# Patient Record
Sex: Male | Born: 1975 | Race: Black or African American | Hispanic: No | Marital: Married
Health system: Southern US, Community
[De-identification: ages and names within clinical notes are randomized; demographics above are authoritative.]

## PROBLEM LIST (undated history)

## (undated) DIAGNOSIS — N182 Chronic kidney disease, stage 2 (mild): Secondary | ICD-10-CM

## (undated) DIAGNOSIS — E66813 Obesity, class 3: Secondary | ICD-10-CM

## (undated) DIAGNOSIS — G4733 Obstructive sleep apnea (adult) (pediatric): Secondary | ICD-10-CM

## (undated) DIAGNOSIS — G4730 Sleep apnea, unspecified: Secondary | ICD-10-CM

## (undated) DIAGNOSIS — R6882 Decreased libido: Secondary | ICD-10-CM

## (undated) DIAGNOSIS — G43709 Chronic migraine without aura, not intractable, without status migrainosus: Secondary | ICD-10-CM

## (undated) DIAGNOSIS — K589 Irritable bowel syndrome without diarrhea: Secondary | ICD-10-CM

## (undated) DIAGNOSIS — T7840XA Allergy, unspecified, initial encounter: Secondary | ICD-10-CM

## (undated) DIAGNOSIS — K2970 Gastritis, unspecified, without bleeding: Secondary | ICD-10-CM

## (undated) DIAGNOSIS — R030 Elevated blood-pressure reading, without diagnosis of hypertension: Secondary | ICD-10-CM

## (undated) DIAGNOSIS — K219 Gastro-esophageal reflux disease without esophagitis: Secondary | ICD-10-CM

## (undated) DIAGNOSIS — M545 Low back pain, unspecified: Secondary | ICD-10-CM

## (undated) DIAGNOSIS — B9681 Helicobacter pylori [H. pylori] as the cause of diseases classified elsewhere: Secondary | ICD-10-CM

## (undated) HISTORY — DX: Morbid (severe) obesity due to excess calories: E66.01

## (undated) HISTORY — DX: Irritable bowel syndrome, unspecified: K58.9

## (undated) HISTORY — DX: Gastritis, unspecified, without bleeding: K29.70

## (undated) HISTORY — DX: Helicobacter pylori (H. pylori) as the cause of diseases classified elsewhere: B96.81

## (undated) HISTORY — DX: Chronic kidney disease, stage 2 (mild): N18.2

## (undated) HISTORY — DX: Allergy, unspecified, initial encounter: T78.40XA

## (undated) HISTORY — DX: Low back pain, unspecified: M54.50

## (undated) HISTORY — DX: Low back pain: M54.5

## (undated) HISTORY — DX: Obesity, class 3: E66.813

## (undated) HISTORY — DX: Decreased libido: R68.82

## (undated) HISTORY — DX: Obstructive sleep apnea (adult) (pediatric): G47.33

## (undated) HISTORY — DX: Sleep apnea, unspecified: G47.30

## (undated) HISTORY — DX: Chronic migraine without aura, not intractable, without status migrainosus: G43.709

## (undated) HISTORY — DX: Gastro-esophageal reflux disease without esophagitis: K21.9

## (undated) HISTORY — DX: Elevated blood-pressure reading, without diagnosis of hypertension: R03.0

---

## 2007-10-19 DIAGNOSIS — R5381 Other malaise: Secondary | ICD-10-CM | POA: Insufficient documentation

## 2009-07-18 DIAGNOSIS — G43009 Migraine without aura, not intractable, without status migrainosus: Secondary | ICD-10-CM | POA: Insufficient documentation

## 2009-12-28 ENCOUNTER — Ambulatory Visit: Payer: Self-pay | Admitting: General Practice

## 2012-09-08 ENCOUNTER — Ambulatory Visit: Payer: Self-pay | Admitting: Family Medicine

## 2014-05-02 LAB — BASIC METABOLIC PANEL
BUN: 9 mg/dL (ref 4–21)
CREATININE: 1.5 mg/dL — AB (ref 0.6–1.3)
GLUCOSE: 107 mg/dL
POTASSIUM: 4.6 mmol/L (ref 3.4–5.3)
Sodium: 140 mmol/L (ref 137–147)

## 2014-05-02 LAB — LIPID PANEL: LDl/HDL Ratio: 3.5

## 2014-05-02 LAB — CBC AND DIFFERENTIAL
HEMATOCRIT: 41 % (ref 41–53)
Hemoglobin: 13.7 g/dL (ref 13.5–17.5)
NEUTROS ABS: 50 /uL
Platelets: 279 10*3/uL (ref 150–399)
WBC: 5.8 10*3/mL

## 2014-05-02 LAB — HEPATIC FUNCTION PANEL
ALK PHOS: 55 U/L (ref 25–125)
ALT: 19 U/L (ref 10–40)
AST: 22 U/L (ref 14–40)

## 2014-05-08 ENCOUNTER — Ambulatory Visit: Payer: Self-pay | Admitting: Family Medicine

## 2014-05-30 ENCOUNTER — Ambulatory Visit: Payer: Self-pay | Admitting: Physician Assistant

## 2014-10-19 DIAGNOSIS — M549 Dorsalgia, unspecified: Secondary | ICD-10-CM | POA: Insufficient documentation

## 2014-10-19 DIAGNOSIS — R200 Anesthesia of skin: Secondary | ICD-10-CM | POA: Insufficient documentation

## 2014-10-19 DIAGNOSIS — Z111 Encounter for screening for respiratory tuberculosis: Secondary | ICD-10-CM | POA: Insufficient documentation

## 2014-10-19 DIAGNOSIS — J309 Allergic rhinitis, unspecified: Secondary | ICD-10-CM | POA: Insufficient documentation

## 2014-10-19 DIAGNOSIS — R030 Elevated blood-pressure reading, without diagnosis of hypertension: Secondary | ICD-10-CM | POA: Insufficient documentation

## 2014-10-19 DIAGNOSIS — IMO0001 Reserved for inherently not codable concepts without codable children: Secondary | ICD-10-CM | POA: Insufficient documentation

## 2014-10-19 DIAGNOSIS — G4733 Obstructive sleep apnea (adult) (pediatric): Secondary | ICD-10-CM | POA: Insufficient documentation

## 2014-10-19 DIAGNOSIS — K219 Gastro-esophageal reflux disease without esophagitis: Secondary | ICD-10-CM | POA: Insufficient documentation

## 2014-10-19 DIAGNOSIS — K589 Irritable bowel syndrome without diarrhea: Secondary | ICD-10-CM | POA: Insufficient documentation

## 2014-10-19 DIAGNOSIS — N182 Chronic kidney disease, stage 2 (mild): Secondary | ICD-10-CM | POA: Insufficient documentation

## 2014-10-19 DIAGNOSIS — B9681 Helicobacter pylori [H. pylori] as the cause of diseases classified elsewhere: Secondary | ICD-10-CM | POA: Insufficient documentation

## 2014-10-19 DIAGNOSIS — Z713 Dietary counseling and surveillance: Secondary | ICD-10-CM | POA: Insufficient documentation

## 2014-10-19 DIAGNOSIS — J069 Acute upper respiratory infection, unspecified: Secondary | ICD-10-CM

## 2014-10-19 DIAGNOSIS — K625 Hemorrhage of anus and rectum: Secondary | ICD-10-CM | POA: Insufficient documentation

## 2014-10-19 DIAGNOSIS — B9689 Other specified bacterial agents as the cause of diseases classified elsewhere: Secondary | ICD-10-CM | POA: Insufficient documentation

## 2014-10-19 DIAGNOSIS — K2970 Gastritis, unspecified, without bleeding: Secondary | ICD-10-CM

## 2014-10-19 DIAGNOSIS — R202 Paresthesia of skin: Secondary | ICD-10-CM

## 2014-10-19 DIAGNOSIS — R109 Unspecified abdominal pain: Secondary | ICD-10-CM | POA: Insufficient documentation

## 2014-10-19 DIAGNOSIS — S93409A Sprain of unspecified ligament of unspecified ankle, initial encounter: Secondary | ICD-10-CM | POA: Insufficient documentation

## 2014-10-19 DIAGNOSIS — K5900 Constipation, unspecified: Secondary | ICD-10-CM | POA: Insufficient documentation

## 2014-10-19 DIAGNOSIS — M25569 Pain in unspecified knee: Secondary | ICD-10-CM | POA: Insufficient documentation

## 2014-12-08 ENCOUNTER — Encounter (INDEPENDENT_AMBULATORY_CARE_PROVIDER_SITE_OTHER): Payer: Self-pay | Admitting: Family Medicine

## 2014-12-13 ENCOUNTER — Telehealth: Payer: Self-pay

## 2014-12-13 ENCOUNTER — Ambulatory Visit (INDEPENDENT_AMBULATORY_CARE_PROVIDER_SITE_OTHER): Payer: BC Managed Care – PPO | Admitting: Family Medicine

## 2014-12-13 ENCOUNTER — Encounter: Payer: Self-pay | Admitting: Family Medicine

## 2014-12-13 ENCOUNTER — Ambulatory Visit
Admission: RE | Admit: 2014-12-13 | Discharge: 2014-12-13 | Disposition: A | Discharge status: Home / Self Care | Payer: BC Managed Care – PPO | Source: Ambulatory Visit | Attending: Family Medicine | Admitting: Family Medicine

## 2014-12-13 VITALS — BP 130/86 | HR 100 | Temp 98.2°F | Resp 18 | Ht 76.0 in | Wt 330.2 lb

## 2014-12-13 DIAGNOSIS — Z0000 Encounter for general adult medical examination without abnormal findings: Secondary | ICD-10-CM | POA: Diagnosis not present

## 2014-12-13 DIAGNOSIS — M25561 Pain in right knee: Secondary | ICD-10-CM

## 2014-12-13 DIAGNOSIS — M25461 Effusion, right knee: Secondary | ICD-10-CM | POA: Diagnosis not present

## 2014-12-13 DIAGNOSIS — M25562 Pain in left knee: Secondary | ICD-10-CM

## 2014-12-13 DIAGNOSIS — M170 Bilateral primary osteoarthritis of knee: Secondary | ICD-10-CM | POA: Insufficient documentation

## 2014-12-13 NOTE — Patient Instructions (Signed)
Knee Exercises EXERCISES RANGE OF MOTION (ROM) AND STRETCHING EXERCISES These exercises may help you when beginning to rehabilitate your injury. Your symptoms may resolve with or without further involvement from your physician, physical therapist, or athletic trainer. While completing these exercises, remember:   Restoring tissue flexibility helps normal motion to return to the joints. This allows healthier, less painful movement and activity.  An effective stretch should be held for at least 30 seconds.  A stretch should never be painful. You should only feel a gentle lengthening or release in the stretched tissue. STRETCH - Knee Extension, Prone  Lie on your stomach on a firm surface, such as a bed or countertop. Place your right / left knee and leg just beyond the edge of the surface. You may wish to place a towel under the far end of your right / left thigh for comfort.  Relax your leg muscles and allow gravity to straighten your knee. Your clinician may advise you to add an ankle weight if more resistance is helpful for you.  You should feel a stretch in the back of your right / left knee. Hold this position for __________ seconds. Repeat __________ times. Complete this stretch __________ times per day. * Your physician, physical therapist, or athletic trainer may ask you to add ankle weight to enhance your stretch.  RANGE OF MOTION - Knee Flexion, Active  Lie on your back with both knees straight. (If this causes back discomfort, bend your opposite knee, placing your foot flat on the floor.)  Slowly slide your heel back toward your buttocks until you feel a gentle stretch in the front of your knee or thigh.  Hold for __________ seconds. Slowly slide your heel back to the starting position. Repeat __________ times. Complete this exercise __________ times per day.  STRETCH - Quadriceps, Prone   Lie on your stomach on a firm surface, such as a bed or padded floor.  Bend your right /  left knee and grasp your ankle. If you are unable to reach your ankle or pant leg, use a belt around your foot to lengthen your reach.  Gently pull your heel toward your buttocks. Your knee should not slide out to the side. You should feel a stretch in the front of your thigh and/or knee.  Hold this position for __________ seconds. Repeat __________ times. Complete this stretch __________ times per day.  STRETCH - Hamstrings, Supine   Lie on your back. Loop a belt or towel over the ball of your right / left foot.  Straighten your right / left knee and slowly pull on the belt to raise your leg. Do not allow the right / left knee to bend. Keep your opposite leg flat on the floor.  Raise the leg until you feel a gentle stretch behind your right / left knee or thigh. Hold this position for __________ seconds. Repeat __________ times. Complete this stretch __________ times per day.  STRENGTHENING EXERCISES These exercises may help you when beginning to rehabilitate your injury. They may resolve your symptoms with or without further involvement from your physician, physical therapist, or athletic trainer. While completing these exercises, remember:   Muscles can gain both the endurance and the strength needed for everyday activities through controlled exercises.  Complete these exercises as instructed by your physician, physical therapist, or athletic trainer. Progress the resistance and repetitions only as guided.  You may experience muscle soreness or fatigue, but the pain or discomfort you are trying to eliminate  should never worsen during these exercises. If this pain does worsen, stop and make certain you are following the directions exactly. If the pain is still present after adjustments, discontinue the exercise until you can discuss the trouble with your clinician. STRENGTH - Quadriceps, Isometrics  Lie on your back with your right / left leg extended and your opposite knee  bent.  Gradually tense the muscles in the front of your right / left thigh. You should see either your knee cap slide up toward your hip or increased dimpling just above the knee. This motion will push the back of the knee down toward the floor/mat/bed on which you are lying.  Hold the muscle as tight as you can without increasing your pain for __________ seconds.  Relax the muscles slowly and completely in between each repetition. Repeat __________ times. Complete this exercise __________ times per day.  STRENGTH - Quadriceps, Short Arcs   Lie on your back. Place a __________ inch towel roll under your knee so that the knee slightly bends.  Raise only your lower leg by tightening the muscles in the front of your thigh. Do not allow your thigh to rise.  Hold this position for __________ seconds. Repeat __________ times. Complete this exercise __________ times per day.  OPTIONAL ANKLE WEIGHTS: Begin with ____________________, but DO NOT exceed ____________________. Increase in 1 pound/0.5 kilogram increments.  STRENGTH - Quadriceps, Straight Leg Raises  Quality counts! Watch for signs that the quadriceps muscle is working to insure you are strengthening the correct muscles and not "cheating" by substituting with healthier muscles.  Lay on your back with your right / left leg extended and your opposite knee bent.  Tense the muscles in the front of your right / left thigh. You should see either your knee cap slide up or increased dimpling just above the knee. Your thigh may even quiver.  Tighten these muscles even more and raise your leg 4 to 6 inches off the floor. Hold for __________ seconds.  Keeping these muscles tense, lower your leg.  Relax the muscles slowly and completely in between each repetition. Repeat __________ times. Complete this exercise __________ times per day.  STRENGTH - Hamstring, Curls  Lay on your stomach with your legs extended. (If you lay on a bed, your feet  may hang over the edge.)  Tighten the muscles in the back of your thigh to bend your right / left knee up to 90 degrees. Keep your hips flat on the bed/floor.  Hold this position for __________ seconds.  Slowly lower your leg back to the starting position. Repeat __________ times. Complete this exercise __________ times per day.  OPTIONAL ANKLE WEIGHTS: Begin with ____________________, but DO NOT exceed ____________________. Increase in 1 pound/0.5 kilogram increments.  STRENGTH - Quadriceps, Squats  Stand in a door frame so that your feet and knees are in line with the frame.  Use your hands for balance, not support, on the frame.  Slowly lower your weight, bending at the hips and knees. Keep your lower legs upright so that they are parallel with the door frame. Squat only within the range that does not increase your knee pain. Never let your hips drop below your knees.  Slowly return upright, pushing with your legs, not pulling with your hands. Repeat __________ times. Complete this exercise __________ times per day.  STRENGTH - Quadriceps, Wall Slides  Follow guidelines for form closely. Increased knee pain often results from poorly placed feet or knees.  [employer]  against a smooth wall or door and walk your feet out 18-24 inches. Place your feet hip-width apart.  Slowly slide down the wall or door until your knees bend __________ degrees.* Keep your knees over your heels, not your toes, and in line with your hips, not falling to either side.  Hold for __________ seconds. Stand up to rest for __________ seconds in between each repetition. Repeat __________ times. Complete this exercise __________ times per day. * Your physician, physical therapist, or athletic trainer will alter this angle based on your symptoms and progress. Document Released: 04/16/2005 Document Revised: 10/17/2013 Document Reviewed: 09/14/2008 [facility] Patient Information 2015 [place], [place]. This information is not  intended to replace advice given to you by your health care provider. Make sure you discuss any questions you have with your health care provider.

## 2014-12-13 NOTE — Progress Notes (Signed)
Name: Samuel Chaney   MRN: [id number]    DOB: June 14, 1976   Date:12/13/2014       Progress Note  Subjective  Chief Complaint  Chief Complaint  Patient presents with  . Annual Exam    HPI  Patient is here today for a Complete Male Physical Exam:  The patient has no acute concerns but does have ongoing issues with his right knee since about 10/15/2014. Overall feels health is stable. Diet is not well balanced, large portions, unhealthy content. In general does not exercise regularly but also feels limited by his joint issues. Has access to a gym at his housing complex. Sees dentist regularly and addresses vision concerns with ophthalmologist if applicable. In regards to sexual activity the patient is currently sexually active. Currently is not concerned about exposure to any STDs.   Joint/Muscle Pain: Patient complains of arthralgias for which has been present for 2 months. Pain is located in the right knee (original injury) more than the left knee which is now starting to ache as he is shifting weight to his left side. Pain is described as aching and throbbing, and is intermittent .  Associated symptoms include: decreased range of motion and instability.  The patient has used pain medication during initial onset of injury but since has weaned off.  Has tried a OTC knee sleeve but it was pinching the back of his leg causing more discomfort. He has also tried massage therapy and home PT.     Past Medical History  Diagnosis Date  . Temporal pain   . Sleep apnea     History reviewed. No pertinent past surgical history.  Family History  Problem Relation Age of Onset  . Bleeding Disorder Mother   . Stroke Father   . Heart disease Father   . Alcohol abuse Father   . Depression Maternal Grandmother   . Mental illness Maternal Grandmother     History   Social History  . Marital Status: Married    Spouse Name: N/A  . Number of Children: 2  . Years of Education: N/A   Occupational  History  . correctional officier    Social History Main Topics  . Smoking status: Never Smoker   . Smokeless tobacco: Not on file  . Alcohol Use: No  . Drug Use: No  . Sexual Activity:    Partners: Female   Other Topics Concern  . Not on file   Social History Narrative    No current outpatient prescriptions on file.  No Known Allergies  ROS  CONSTITUTIONAL: No significant weight changes, fever, chills, weakness or fatigue.  HEENT:  - Eyes: No visual changes.  - Ears: No auditory changes. No pain.  - Nose: No sneezing, congestion, runny nose. - Throat: No sore throat. No changes in swallowing. SKIN: No rash or itching.  CARDIOVASCULAR: No chest pain, chest pressure or chest discomfort. No palpitations or edema.  RESPIRATORY: No shortness of breath, cough or sputum.  GASTROINTESTINAL: No anorexia, nausea, vomiting. No changes in bowel habits. No abdominal pain or blood.  GENITOURINARY: No dysuria. No frequency. No discharge.  NEUROLOGICAL: No headache, dizziness, syncope, paralysis, ataxia, numbness or tingling in the extremities. No memory changes. No change in bowel or bladder control.  MUSCULOSKELETAL: Yes knee joint pain. No muscle pain. HEMATOLOGIC: No anemia, bleeding or bruising.  LYMPHATICS: No enlarged lymph nodes.  PSYCHIATRIC: No change in mood. No change in sleep pattern.  ENDOCRINOLOGIC: No reports of sweating, cold or heat intolerance. No  polyuria or polydipsia.   Objective  Filed Vitals:   12/13/14 1144  BP: 130/86  Pulse: 100  Temp: 98.2 F (36.8 C)  TempSrc: Oral  Resp: 18  Height: 6' 4"  (1.93 m)  Weight: 330 lb 3.2 oz (149.778 kg)  SpO2: 98%   Body mass index is 40.21 kg/(m^2).  Depression screen PHQ 2/9 12/13/2014  Decreased Interest 0  Down, Depressed, Hopeless 0  PHQ - 2 Score 0     Physical Exam  Constitutional: Patient is obese and well-nourished. In no distress.  HEENT:  - Head: Normocephalic and atraumatic.  - Ears: Bilateral  TMs gray, no erythema or effusion - Nose: Nasal mucosa moist - Mouth/Throat: Oropharynx is clear and moist. No tonsillar hypertrophy or erythema. No post nasal drainage.  - Eyes: Conjunctivae clear, EOM movements normal. PERRLA. No scleral icterus.  Neck: Dark skin around base of neck. Normal range of motion. Neck supple. No JVD present. No thyromegaly present.  Cardiovascular: Normal rate, regular rhythm and normal heart sounds.  No murmur heard.  Pulmonary/Chest: Effort normal and breath sounds normal. No respiratory distress. Abdominal: Soft. Bowel sounds are normal, no distension. There is no tenderness. no masses BREAST: Bilateral breast exam normal with no masses, skin changes or nipple discharge MALE GENITALIA: Bilateral testes descended with no masses, no penile lesions, no penile discharge. PROSTATE: deferred RECTAL: deferred Musculoskeletal: Normal range of motion bilateral UE and LE, no joint effusions.  Right Knee Exam   Tenderness  The patient is experiencing tenderness in the medial joint line.  Range of Motion  Extension: normal  Flexion: normal   Muscle Strength   The patient has normal right knee strength.  Tests  McMurray:  Medial - negative Lateral - negative Lachman:  Anterior - negative    Posterior - negative Drawer:       Anterior - negative    Posterior - negative Varus: negative Valgus: negative  Other  Erythema: absent Sensation: normal Swelling: mild   Left Knee Exam   Tenderness  The patient is experiencing no tenderness.     Range of Motion  Extension: normal  Flexion: normal   Muscle Strength   The patient has normal left knee strength.  Tests  McMurray:  Medial - negative Lateral - negative Lachman:  Anterior - negative    Posterior - negative Drawer:       Anterior - negative     Posterior - negative Varus: negative Valgus: positive  Other  Erythema: absent Sensation: normal Swelling: none     Peripheral vascular:  Bilateral LE no edema. Neurological: CN II-XII grossly intact with no focal deficits. Alert and oriented to person, place, and time. Coordination, balance, strength, speech and gait are normal.  Skin: Skin is warm and dry. No rash noted. No erythema.  Psychiatric: Patient has a normal mood and affect. Behavior is normal in office today. Judgment and thought content normal in office today.    Assessment & Plan  1. Annual physical exam He has declined blood work today, he does not get annual flu exams. He is using his CPAP machine.  2. Right knee pain Will get Xray and order right knee brace through [facility].   - DG Knee Complete 4 Views Right; Future  3. Left knee pain Will get Xray  4. Obesity class III, BMI 40-49.9  The patient has been counseled on their higher than normal BMI.  They have verbally expressed understanding their increased risk for other diseases.  In  efforts to meet a better target BMI goal the patient has been counseled on lifestyle, diet and exercise modification tactics. Start with moderate intensity aerobic exercise (walking, jogging, elliptical, swimming, group or individual sports, hiking) at least 50mns a day at least 4 days a week and increase intensity, duration, frequency as tolerated. Diet should include well balance fresh fruits and vegetables avoiding processed foods, carbohydrates and sugars. Drink at least 8oz 10 glasses a day avoiding sodas, sugary fruit drinks, sweetened tea. Check weight on a reliable scale daily and monitor weight loss progress daily. Consider investing in mobile phone apps that will help keep track of weight loss goals.  - DG Knee Complete 4 Views Left; Future

## 2014-12-13 NOTE — Telephone Encounter (Signed)
A physician's statement of medical necessity was completed by Dr. Bobetta Lime and faxed to [phone]. Confirmation was received and items were sent to be scanned into this patient's chart.

## 2014-12-15 ENCOUNTER — Encounter: Payer: Self-pay | Admitting: Family Medicine

## 2014-12-15 DIAGNOSIS — M1711 Unilateral primary osteoarthritis, right knee: Secondary | ICD-10-CM | POA: Insufficient documentation

## 2015-03-21 ENCOUNTER — Ambulatory Visit (INDEPENDENT_AMBULATORY_CARE_PROVIDER_SITE_OTHER): Payer: BC Managed Care – PPO | Admitting: Family Medicine

## 2015-03-21 ENCOUNTER — Ambulatory Visit
Admission: RE | Admit: 2015-03-21 | Discharge: 2015-03-21 | Disposition: A | Discharge status: Home / Self Care | Payer: BC Managed Care – PPO | Source: Ambulatory Visit | Attending: Family Medicine | Admitting: Family Medicine

## 2015-03-21 ENCOUNTER — Encounter: Payer: Self-pay | Admitting: Family Medicine

## 2015-03-21 VITALS — BP 130/82 | HR 108 | Temp 98.1°F | Resp 18 | Wt 329.3 lb

## 2015-03-21 DIAGNOSIS — M722 Plantar fascial fibromatosis: Secondary | ICD-10-CM

## 2015-03-21 NOTE — Progress Notes (Signed)
Name: Samuel Chaney   MRN: [id number]    DOB: August 21, 1975   Date:03/21/2015       Progress Note  Subjective  Chief Complaint  Chief Complaint  Patient presents with  . Foot Pain    right foot, no known injury. patient stated he feels a knot everytime he walks. pain upon flexion.    HPI  Samuel Chaney is a 39 year old male with acute concerns of right foot pain for a few weeks now. The location is medial aspect near the ball of his foot. Rest alleviates the pain however the first few steps after resting are very painful. Feels like his muscle is tearing and there is a mass at the ball of his foot. No injury or trauma. He wears heavy shoes for work on hard ground and can not wear his shoe inserts.   Past Medical History  Diagnosis Date  . Temporal pain   . Sleep apnea   . Obesity, Class III, BMI 40-49.9 (morbid obesity) ([place])   . Chronic migraine w/o aura w/o status migrainosus, not intractable   . Severe obstructive sleep apnea   . Elevated blood pressure reading without diagnosis of hypertension   . Temporal pain   . Malaise and fatigue   . Decreased libido   . GERD (gastroesophageal reflux disease)   . Allergy   . Helicobacter positive gastritis   . IBS (irritable bowel syndrome)   . Acute pain of right knee   . Lumbosacral pain   . CKD (chronic kidney disease), stage II   . Dysphagia   . Numbness and tingling     Patient Active Problem List   Diagnosis Date Noted  . Primary osteoarthritis of right knee 12/15/2014  . Abdominal cramping 10/19/2014  . Gonalgia 10/19/2014  . Allergic rhinitis 10/19/2014  . Blood pressure elevated without history of HTN 10/19/2014  . Chronic kidney disease (CKD), stage II (mild) 10/19/2014  . CN (constipation) 10/19/2014  . Gastro-esophageal reflux disease without esophagitis 10/19/2014  . Gastritis, Helicobacter pylori 05/39/7673  . Adaptive colitis 10/19/2014  . Back ache 10/19/2014  . Numbness and tingling 10/19/2014  . Bleeding  per rectum 10/19/2014  . Sprain of ankle 10/19/2014  . Screening examination for pulmonary tuberculosis 10/19/2014  . Dietary counseling and surveillance 10/19/2014  . Severe obstructive sleep apnea 10/19/2014  . Migraine without aura and responsive to treatment 07/18/2009  . Headache, temporal 07/18/2009  . Decreased libido 10/19/2007  . Obesity, Class III, BMI 40-49.9 (morbid obesity) ([place]) 10/14/2006  . Routine general medical examination at a health care facility 10/14/2006    Social History  Substance Use Topics  . Smoking status: Never Smoker   . Smokeless tobacco: Not on file  . Alcohol Use: No    No current outpatient prescriptions on file.  No Known Allergies  Review of Systems  Positive for right foot pain as mentioned in HPI, otherwise all systems reviewed and are negative.  Objective  BP 130/82 mmHg  Pulse 108  Temp(Src) 98.1 F (36.7 C) (Oral)  Resp 18  Wt 329 lb 4.8 oz (149.37 kg)  SpO2 96%  Body mass index is 40.1 kg/(m^2).   Physical Exam  Constitutional: Patient is obese and well-nourished. In no distress.  Cardiovascular: Normal rate, regular rhythm and normal heart sounds.  No murmur heard.  Pulmonary/Chest: Effort normal and breath sounds normal. No respiratory distress. Musculoskeletal: Normal range of motion bilateral UE and LE, no joint effusions. Right foot flat arch, dry skin,  thick nails. Pain reproduced at the medial aspect insertion of plantar fascia at calcaneous. Full ROM of ankle joint. No achilles tendon pain. Peripheral vascular: Bilateral LE no edema. Skin: Skin is warm and dry. No rash noted. No erythema.  Psychiatric: Patient has a normal mood and affect. Behavior is normal in office today. Judgment and thought content normal in office today.    Assessment & Plan  1. Plantar fasciitis of right foot Instructed on home therapy, exercises. Time off from work, note provided. Will get x-ray to assess for heal spur. If symptoms  persist may benefit from corticosteroid injection.  - DG Foot Complete Right; Future

## 2015-03-21 NOTE — Patient Instructions (Signed)

## 2015-03-29 ENCOUNTER — Ambulatory Visit (INDEPENDENT_AMBULATORY_CARE_PROVIDER_SITE_OTHER): Payer: BC Managed Care – PPO | Admitting: Family Medicine

## 2015-03-29 ENCOUNTER — Encounter: Payer: Self-pay | Admitting: Family Medicine

## 2015-03-29 VITALS — BP 116/80 | HR 78 | Temp 98.2°F | Resp 16 | Wt 329.0 lb

## 2015-03-29 DIAGNOSIS — M722 Plantar fascial fibromatosis: Secondary | ICD-10-CM | POA: Diagnosis not present

## 2015-03-29 MED ORDER — TRAMADOL HCL 50 MG PO TABS: 50.0000 mg | ORAL_TABLET | Freq: Four times a day (QID) | ORAL | Status: DC | PRN

## 2015-03-29 NOTE — Progress Notes (Signed)
Name: Samuel Chaney   MRN: [id number]    DOB: February 01, 1976   Date:03/29/2015       Progress Note  Subjective  Chief Complaint  Chief Complaint  Patient presents with  . Foot Pain    patient is here for a follow-up. patient stated that his sx are nonsresponisive to medications and stretching.    HPI  Samuel Chaney is a 39 year old male with ongoing concerns of right foot pain for a few weeks now. The location is medial aspect near the ball of his foot. Rest alleviates the pain however the first few steps after resting are very painful. Feels like his muscle is tearing and there is a mass at the ball of his foot. No injury or trauma. He wears heavy shoes for work on hard ground and can not wear his shoe inserts. Diagnosed with plantar fasciitis at our last visit about 1 week ago. Instructed on home exercises, which helped. But he went back to work yesterday and again his pain has returned.    Past Medical History  Diagnosis Date  . Temporal pain   . Sleep apnea   . Obesity, Class III, BMI 40-49.9 (morbid obesity) ([facility])   . Chronic migraine w/o aura w/o status migrainosus, not intractable   . Severe obstructive sleep apnea   . Elevated blood pressure reading without diagnosis of hypertension   . Temporal pain   . Malaise and fatigue   . Decreased libido   . GERD (gastroesophageal reflux disease)   . Allergy   . Helicobacter positive gastritis   . IBS (irritable bowel syndrome)   . Acute pain of right knee   . Lumbosacral pain   . CKD (chronic kidney disease), stage II   . Dysphagia   . Numbness and tingling     Patient Active Problem List   Diagnosis Date Noted  . Plantar fasciitis of right foot 03/21/2015  . Primary osteoarthritis of right knee 12/15/2014  . Abdominal cramping 10/19/2014  . Gonalgia 10/19/2014  . Allergic rhinitis 10/19/2014  . Blood pressure elevated without history of HTN 10/19/2014  . Chronic kidney disease (CKD), stage II (mild) 10/19/2014  . CN  (constipation) 10/19/2014  . Gastro-esophageal reflux disease without esophagitis 10/19/2014  . Gastritis, Helicobacter pylori 09/47/0962  . Adaptive colitis 10/19/2014  . Back ache 10/19/2014  . Numbness and tingling 10/19/2014  . Bleeding per rectum 10/19/2014  . Sprain of ankle 10/19/2014  . Screening examination for pulmonary tuberculosis 10/19/2014  . Dietary counseling and surveillance 10/19/2014  . Severe obstructive sleep apnea 10/19/2014  . Migraine without aura and responsive to treatment 07/18/2009  . Headache, temporal 07/18/2009  . Decreased libido 10/19/2007  . Obesity, Class III, BMI 40-49.9 (morbid obesity) ([place]) 10/14/2006  . Routine general medical examination at a health care facility 10/14/2006    Social History  Substance Use Topics  . Smoking status: Never Smoker   . Smokeless tobacco: Not on file  . Alcohol Use: No    No current outpatient prescriptions on file.  No Known Allergies  Review of Systems  Positive for foot pain as mentioned in HPI, otherwise all systems reviewed and are negative.  Objective  BP 116/80 mmHg  Pulse 78  Temp(Src) 98.2 F (36.8 C) (Oral)  Resp 16  Wt 329 lb (149.233 kg)  SpO2 97%  Body mass index is 40.06 kg/(m^2).   Physical Exam  Constitutional: Patient is obese and well-nourished. In no distress.  Cardiovascular: Normal rate, regular rhythm  and normal heart sounds. No murmur heard.  Pulmonary/Chest: Effort normal and breath sounds normal. No respiratory distress. Musculoskeletal: Normal range of motion bilateral UE and LE, no joint effusions. Right foot flat arch, dry skin, thick nails. Pain reproduced at the medial aspect insertion of plantar fascia at calcaneous. Full ROM of ankle joint. No achilles tendon pain. Peripheral vascular: Bilateral LE no edema. Skin: Skin is warm and dry. No rash noted. No erythema.  Psychiatric: Patient has a normal mood and affect. Behavior is normal in office today. Judgment  and thought content normal in office today.    Assessment & Plan  1. Plantar fasciitis of right foot Tramadol for pain. Refer to podiatry. His right foot xray did not show any fractures or swelling. They did see a tiny plantar calcaneal spur and a slightly larger Achilles region spur. His large body habitus in combination with work boots and environment are likely prolonging healing.  - traMADol (ULTRAM) 50 MG tablet; Take 1 tablet (50 mg total) by mouth every 6 (six) hours as needed for moderate pain or severe pain.  Dispense: 30 tablet; Refill: 0 - Ambulatory referral to Podiatry

## 2015-03-30 ENCOUNTER — Ambulatory Visit: Payer: BC Managed Care – PPO | Admitting: Family Medicine

## 2015-04-04 ENCOUNTER — Ambulatory Visit: Payer: BC Managed Care – PPO

## 2015-04-04 ENCOUNTER — Encounter: Payer: BC Managed Care – PPO | Admitting: Podiatry

## 2015-04-10 NOTE — Progress Notes (Signed)
This encounter was created in error - please disregard.

## 2015-05-21 ENCOUNTER — Encounter: Payer: BC Managed Care – PPO | Admitting: Podiatry

## 2015-05-21 ENCOUNTER — Ambulatory Visit: Payer: Self-pay

## 2015-05-29 NOTE — Progress Notes (Signed)
This encounter was created in error - please disregard.

## 2015-10-02 IMAGING — CR RIGHT ANKLE - COMPLETE 3+ VIEW
1 series · 3 of 3 positions shown · non-contrast
Comparison: None.

CLINICAL DATA: Persistent ankle pain and swelling following
twisting injury 1 week ago. Initial encounter.

EXAM:
RIGHT ANKLE - COMPLETE 3+ VIEW

[Series 1: kdxr ankle right complete · 0.14mm/px · 3 of 3 slices shown]
[im 1/3]
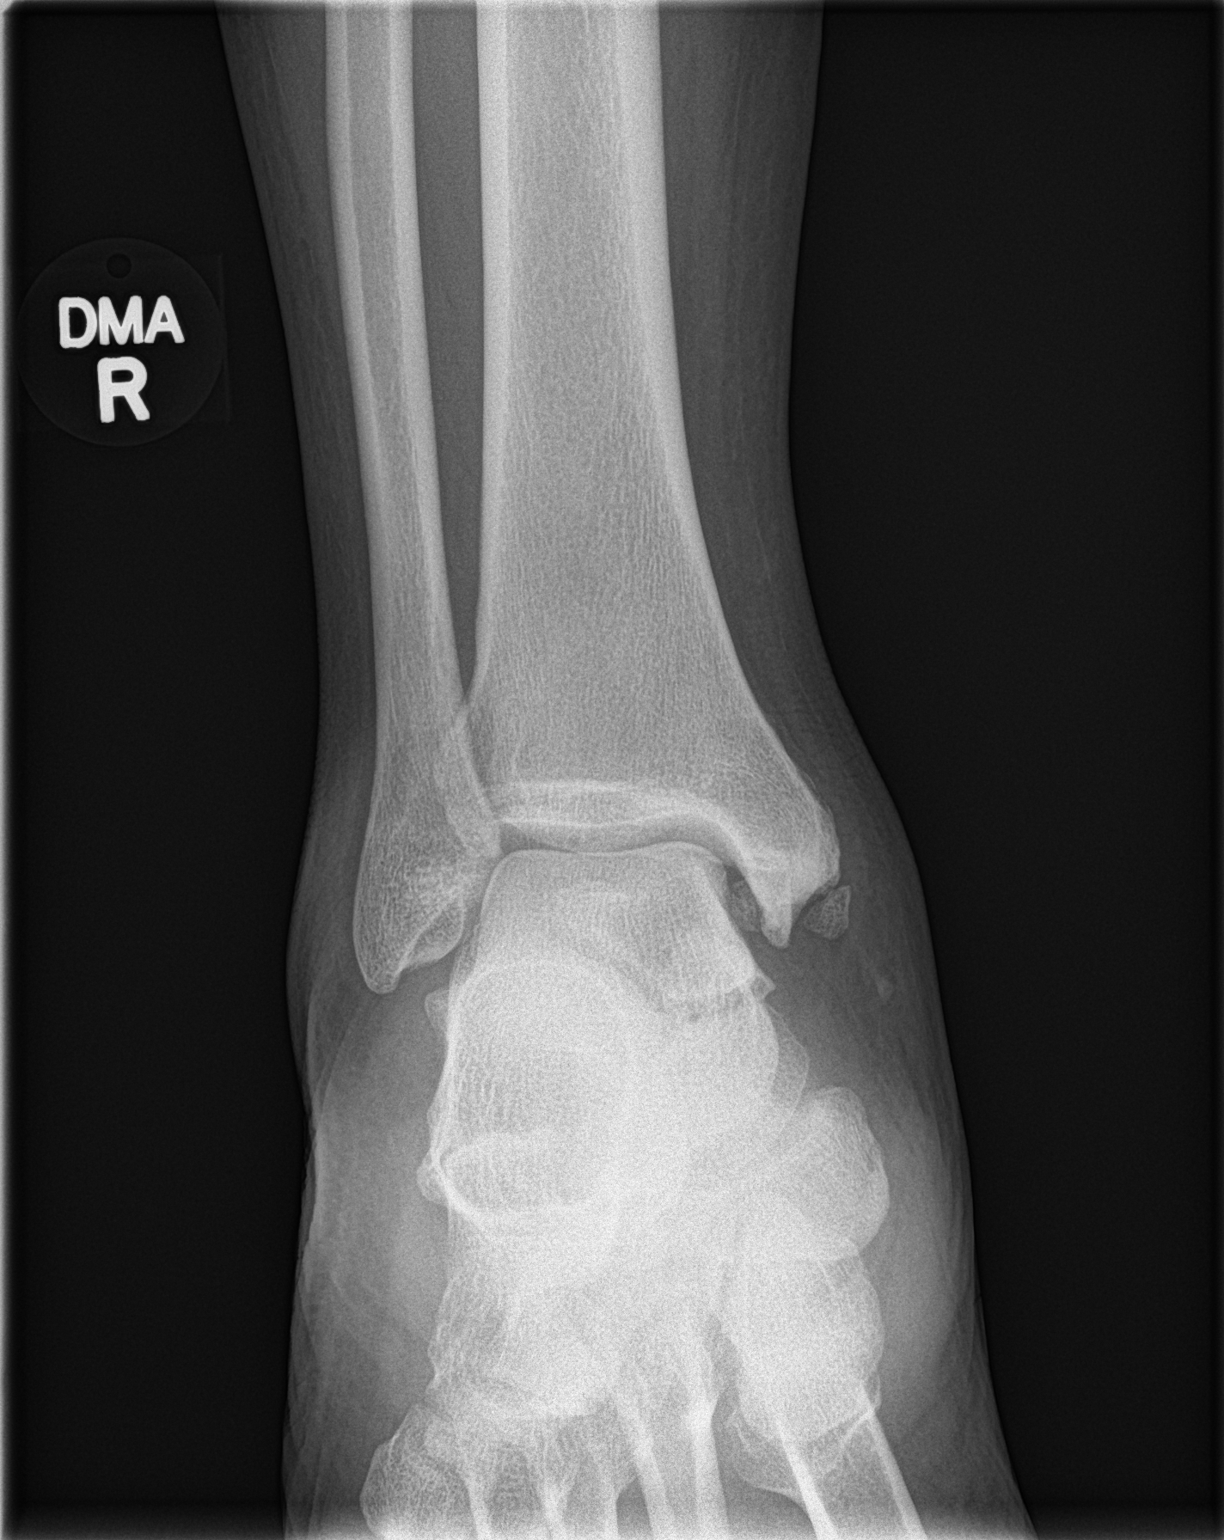
[im 2/3]
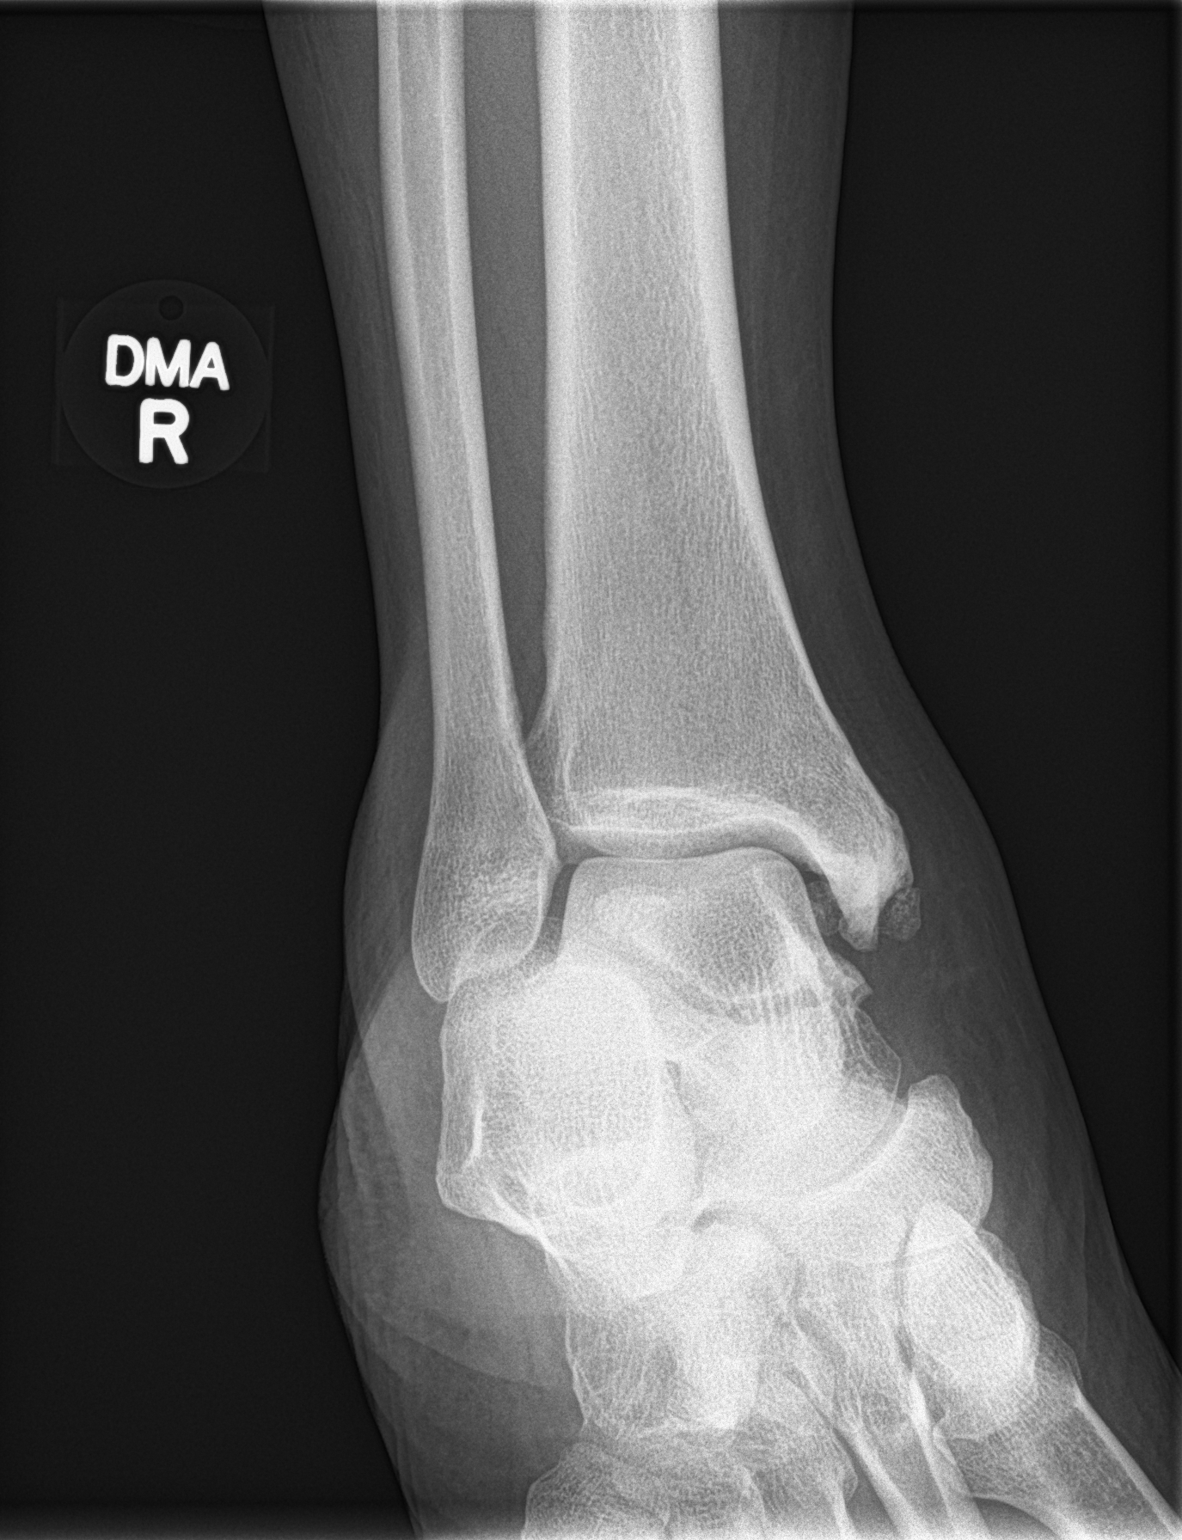
[im 3/3]
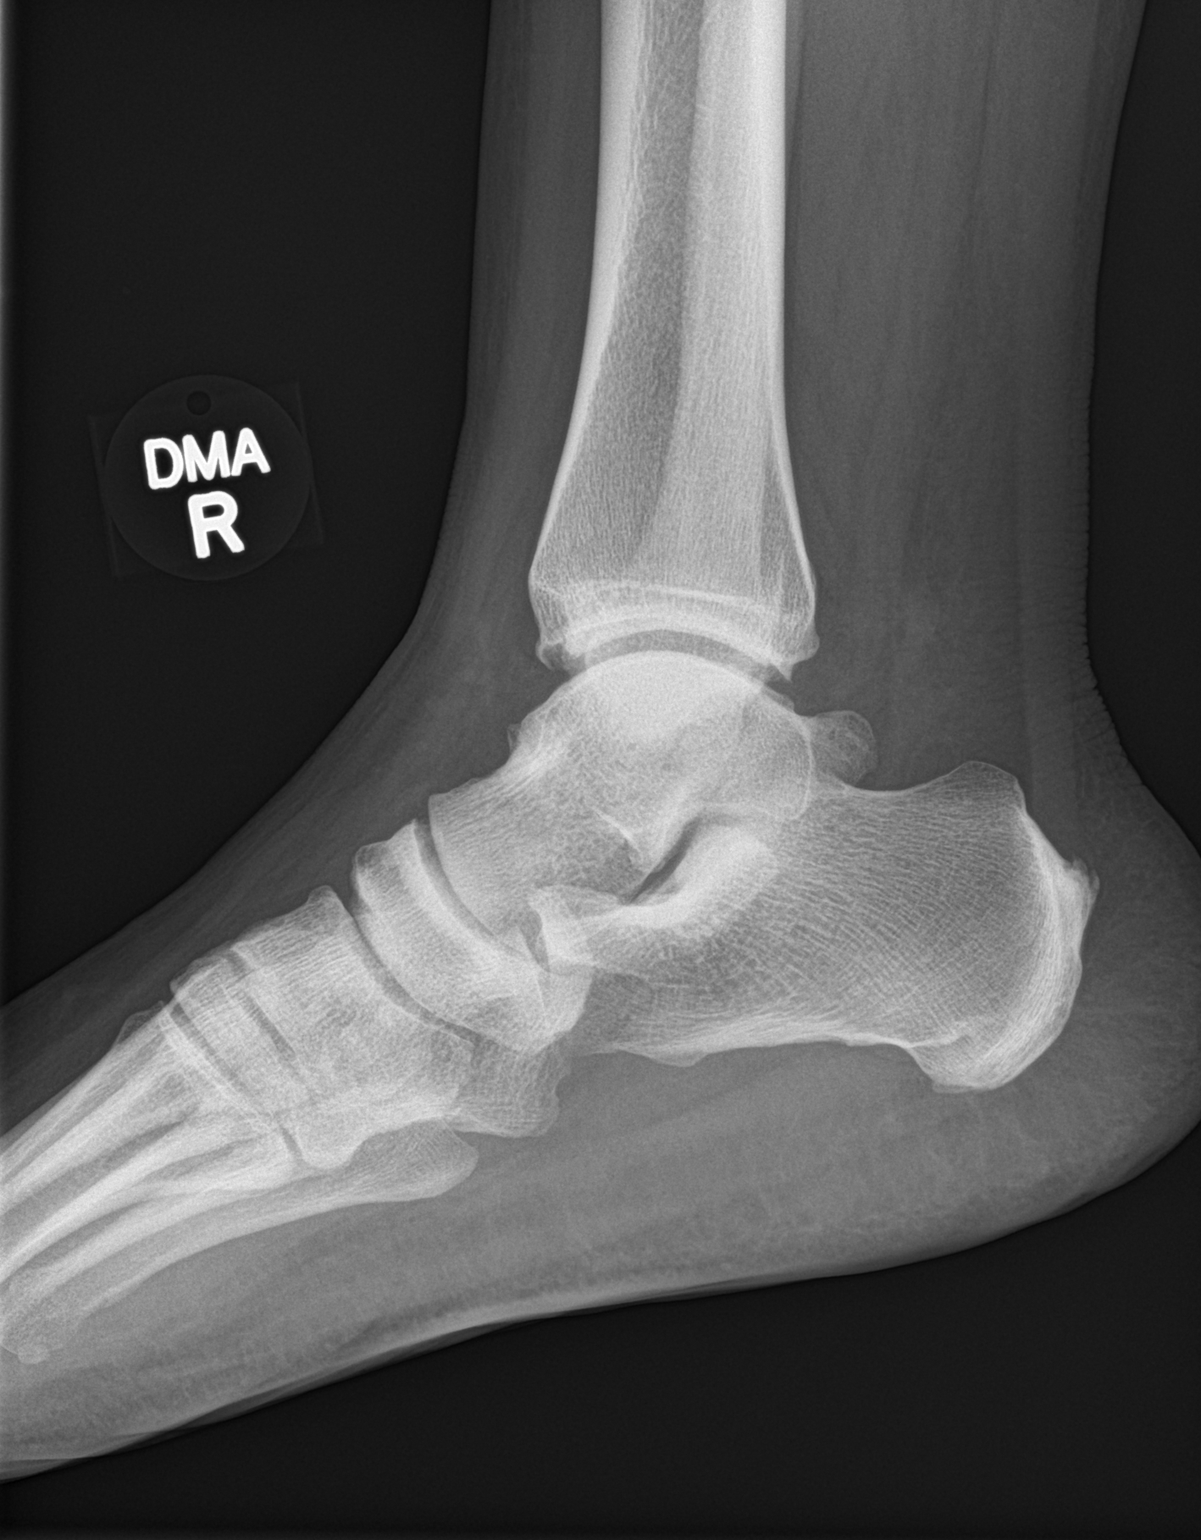

[3 of 3 positions shown; findings below may reference images not displayed]

FINDINGS: The mineralization and alignment are normal. There is no evidence of
acute fracture or dislocation. There are prominent degenerative
changes at the ankle for age with fragmented spurring of the medial
malleolus and small tibiotalar osteophytes. The soft tissue
surrounding ankle are prominent without focal swelling.
IMPRESSION: No acute osseous findings.

## 2015-12-17 ENCOUNTER — Encounter: Payer: BC Managed Care – PPO | Admitting: Family Medicine

## 2016-06-11 ENCOUNTER — Ambulatory Visit (INDEPENDENT_AMBULATORY_CARE_PROVIDER_SITE_OTHER): Payer: BC Managed Care – PPO | Admitting: Family Medicine

## 2016-06-11 ENCOUNTER — Encounter: Payer: Self-pay | Admitting: Family Medicine

## 2016-06-11 VITALS — BP 136/90 | HR 89 | Temp 98.2°F | Resp 16 | Wt 330.1 lb

## 2016-06-11 DIAGNOSIS — G4733 Obstructive sleep apnea (adult) (pediatric): Secondary | ICD-10-CM

## 2016-06-11 DIAGNOSIS — R5383 Other fatigue: Secondary | ICD-10-CM

## 2016-06-11 DIAGNOSIS — N182 Chronic kidney disease, stage 2 (mild): Secondary | ICD-10-CM | POA: Diagnosis not present

## 2016-06-11 DIAGNOSIS — Z23 Encounter for immunization: Secondary | ICD-10-CM

## 2016-06-11 DIAGNOSIS — R6882 Decreased libido: Secondary | ICD-10-CM

## 2016-06-11 DIAGNOSIS — Z0000 Encounter for general adult medical examination without abnormal findings: Secondary | ICD-10-CM | POA: Diagnosis not present

## 2016-06-11 MED ORDER — POLYETHYLENE GLYCOL 3350 17 GM/SCOOP PO POWD: 17.0000 g | Freq: Every day | ORAL | 5 refills | Status: DC

## 2016-06-11 NOTE — Assessment & Plan Note (Signed)
Encouraged hydration; avoiding NSAIDs; check creatinine; may be secondary to increased muscle mass

## 2016-06-11 NOTE — Assessment & Plan Note (Signed)
Not overeating, will check thyroid function

## 2016-06-11 NOTE — Assessment & Plan Note (Signed)
Multifactorial; OSA, but r/o thyroid disease, kidney disease, diabetes, anemia, etc.

## 2016-06-11 NOTE — Assessment & Plan Note (Signed)
So glad he is using CPAP; form completed; weight loss would help

## 2016-06-11 NOTE — Assessment & Plan Note (Signed)
Check yearly labs

## 2016-06-11 NOTE — Assessment & Plan Note (Signed)
Check testosterone (but not early morning); patient would not be interested in treatment with testosterone if low

## 2016-06-11 NOTE — Patient Instructions (Addendum)
I do recommend yearly flu shots; for individuals who don't want flu shots, try to practice excellent hand hygiene, and avoid nursing homes, day cares, and hospitals during peak flu season; taking additional vitamin C daily during flu/cold season may help boost your immune system too  Check out the information at familydoctor.org entitled "Nutrition for Weight Loss: What You Need to Know about Fad Diets" Try to lose between 1-2 pounds per week by taking in fewer calories and burning off more calories You can succeed by limiting portions, limiting foods dense in calories and fat, becoming more active, and drinking 8 glasses of water a day (64 ounces) Don't skip meals, especially breakfast, as skipping meals may alter your metabolism Do not use over-the-counter weight loss pills or gimmicks that claim rapid weight loss A healthy BMI (or body mass index) is between 18.5 and 24.9 You can calculate your ideal BMI at the [place] website ClubMonetize.fr  Your goal blood pressure is less than 140 mmHg on top and less than 90 on the bottom Try to follow the DASH guidelines (DASH stands for Dietary Approaches to Stop Hypertension) Try to limit the sodium in your diet.  Ideally, consume less than 1.5 grams (less than 1,570m) per day. Do not add salt when cooking or at the table.  Check the sodium amount on labels when shopping, and choose items lower in sodium when given a choice. Avoid or limit foods that already contain a lot of sodium. Eat a diet rich in fruits and vegetables and whole grains.   DASH Eating Plan DASH stands for "Dietary Approaches to Stop Hypertension." The DASH eating plan is a healthy eating plan that has been shown to reduce high blood pressure (hypertension). Additional health benefits may include reducing the risk of type 2 diabetes mellitus, heart disease, and stroke. The DASH eating plan may also help with weight loss. What do I need  to know about the DASH eating plan? For the DASH eating plan, you will follow these general guidelines:  Choose foods with less than 150 milligrams of sodium per serving (as listed on the food label).  Use salt-free seasonings or herbs instead of table salt or sea salt.  Check with your health care provider or pharmacist before using salt substitutes.  Eat lower-sodium products. These are often labeled as "low-sodium" or "no salt added."  Eat fresh foods. Avoid eating a lot of canned foods.  Eat more vegetables, fruits, and low-fat dairy products.  Choose whole grains. Look for the word "whole" as the first word in the ingredient list.  Choose fish and skinless chicken or t[place] often than red meat. Limit fish, poultry, and meat to 6 oz (170 g) each day.  Limit sweets, desserts, sugars, and sugary drinks.  Choose heart-healthy fats.  Eat more home-cooked food and less restaurant, buffet, and fast food.  Limit fried foods.  Do not fry foods. Cook foods using methods such as baking, boiling, grilling, and broiling instead.  When eating at a restaurant, ask that your food be prepared with less salt, or no salt if possible. What foods can I eat? Seek help from a dietitian for individual calorie needs. Grains  Whole grain or whole wheat bread. Brown rice. Whole grain or whole wheat pasta. Quinoa, bulgur, and whole grain cereals. Low-sodium cereals. Corn or whole wheat flour tortillas. Whole grain cornbread. Whole grain crackers. Low-sodium crackers. Vegetables  Fresh or frozen vegetables (raw, steamed, roasted, or grilled). Low-sodium or reduced-sodium tomato and vegetable juices.  Low-sodium or reduced-sodium tomato sauce and paste. Low-sodium or reduced-sodium canned vegetables. Fruits  All fresh, canned (in natural juice), or frozen fruits. Meat and Other Protein Products  Ground beef (85% or leaner), grass-fed beef, or beef trimmed of fat. Skinless chicken or [place]. Ground  chicken or [place]. Pork trimmed of fat. All fish and seafood. Eggs. Dried beans, peas, or lentils. Unsalted nuts and seeds. Unsalted canned beans. Dairy  Low-fat dairy products, such as skim or 1% milk, 2% or reduced-fat cheeses, low-fat ricotta or cottage cheese, or plain low-fat yogurt. Low-sodium or reduced-sodium cheeses. Fats and Oils  Tub margarines without trans fats. Light or reduced-fat mayonnaise and salad dressings (reduced sodium). Avocado. Safflower, olive, or canola oils. Natural peanut or almond butter. Other  Unsalted popcorn and pretzels. The items listed above may not be a complete list of recommended foods or beverages. Contact your dietitian for more options.  What foods are not recommended? Grains  White bread. White pasta. White rice. Refined cornbread. Bagels and croissants. Crackers that contain trans fat. Vegetables  Creamed or fried vegetables. Vegetables in a cheese sauce. Regular canned vegetables. Regular canned tomato sauce and paste. Regular tomato and vegetable juices. Fruits  Canned fruit in light or heavy syrup. Fruit juice. Meat and Other Protein Products  Fatty cuts of meat. Ribs, chicken wings, bacon, sausage, bologna, salami, chitterlings, fatback, hot dogs, bratwurst, and packaged luncheon meats. Salted nuts and seeds. Canned beans with salt. Dairy  Whole or 2% milk, cream, half-and-half, and cream cheese. Whole-fat or sweetened yogurt. Full-fat cheeses or blue cheese. Nondairy creamers and whipped toppings. Processed cheese, cheese spreads, or cheese curds. Condiments  Onion and garlic salt, seasoned salt, table salt, and sea salt. Canned and packaged gravies. Worcestershire sauce. Tartar sauce. Barbecue sauce. Teriyaki sauce. Soy sauce, including reduced sodium. Steak sauce. Fish sauce. Oyster sauce. Cocktail sauce. Horseradish. Ketchup and mustard. Meat flavorings and tenderizers. Bouillon cubes. Hot sauce. Tabasco sauce. Marinades. Taco seasonings.  Relishes. Fats and Oils  Butter, stick margarine, lard, shortening, ghee, and bacon fat. Coconut, palm kernel, or palm oils. Regular salad dressings. Other  Pickles and olives. Salted popcorn and pretzels. The items listed above may not be a complete list of foods and beverages to avoid. Contact your dietitian for more information.  Where can I find more information? National Heart, Lung, and Blood Institute: travelstabloid.com This information is not intended to replace advice given to you by your health care provider. Make sure you discuss any questions you have with your health care provider. Document Released: 05/22/2011 Document Revised: 11/08/2015 Document Reviewed: 04/06/2013 Elsevier Interactive Patient Education  2017 [employer].

## 2016-06-11 NOTE — Progress Notes (Signed)
BP 136/90 (BP Location: Right Arm, Patient Position: Sitting, Cuff Size: Large)   Pulse 89   Temp 98.2 F (36.8 C) (Oral)   Resp 16   Wt (!) 330 lb 1.6 oz (149.7 kg)   SpO2 94%   BMI 40.18 kg/m    Subjective:    Patient ID: Samuel Chaney, male    DOB: 02-24-1976, 40 y.o.   MRN: [id number]  HPI: Samuel Chaney is a 40 y.o. male  Chief Complaint  Patient presents with  . Medication Refill  . Sleep Apnea    Patient needs order signed for new equipment.   . Irritable Bowel Syndrome    Patient is having trouble with going regularly and has to force it when he does have to go.    He has sleep apnea; diagnosed a year ago; wearing every mask night; no known fam hx; still tired Waking up with refreshed sleep; has enough energy to get up and go; he can feel drained if sitting still, he'll doze; no hx of anemia, HCT 41 in 2015; not out in the sun; meat eater some; no known thyroid disease; eats twice a day; weight stuck; he knows his testosterone is going to be low  He has to watch out for his kidneys; was told before about kidney issue; creatinine 1.5 in 2015; not taking OTC; not a good water drinker  Not big red meat eater; prefers veggies; has IBS; having to force with BMs; no blood in the stools; no fam hx of colon problems; has not had a colonoscopy  BP a little elevated today; no hx of high BP in the past  Depression screen [facility] 2/9 06/11/2016 12/13/2014  Decreased Interest 0 0  Down, Depressed, Hopeless 0 0  PHQ - 2 Score 0 0   Relevant past medical, surgical, family and social history reviewed Past Medical History:  Diagnosis Date  . Acute pain of right knee   . Allergy   . Chronic migraine w/o aura w/o status migrainosus, not intractable   . CKD (chronic kidney disease), stage II   . Decreased libido   . Dysphagia   . Elevated blood pressure reading without diagnosis of hypertension   . GERD (gastroesophageal reflux disease)   . Helicobacter positive gastritis   .  IBS (irritable bowel syndrome)   . Lumbosacral pain   . Malaise and fatigue   . Numbness and tingling   . Obesity, Class III, BMI 40-49.9 (morbid obesity) ([place])   . Severe obstructive sleep apnea   . Sleep apnea   . Temporal pain   . Temporal pain    No past surgical history on file.   Family History  Problem Relation Age of Onset  . Bleeding Disorder Mother   . Stroke Father   . Heart disease Father   . Alcohol abuse Father   . Depression Maternal Grandmother   . Mental illness Maternal Grandmother    Social History  Substance Use Topics  . Smoking status: Never Smoker  . Smokeless tobacco: Never Used  . Alcohol use No   Interim medical history since last visit reviewed. Allergies and medications reviewed  Review of Systems Per HPI unless specifically indicated above     Objective:    BP 136/90 (BP Location: Right Arm, Patient Position: Sitting, Cuff Size: Large)   Pulse 89   Temp 98.2 F (36.8 C) (Oral)   Resp 16   Wt (!) 330 lb 1.6 oz (149.7 kg)   SpO2 94%  BMI 40.18 kg/m   Wt Readings from Last 3 Encounters:  06/11/16 (!) 330 lb 1.6 oz (149.7 kg)  03/29/15 (!) 329 lb (149.2 kg)  03/21/15 (!) 329 lb 4.8 oz (149.4 kg)    Physical Exam  Constitutional: He appears well-developed and well-nourished. No distress.  Morbidly obese  HENT:  Head: Normocephalic and atraumatic.  Eyes: EOM are normal. No scleral icterus.  Neck: No thyromegaly present.  Cardiovascular: Normal rate and regular rhythm.   Pulmonary/Chest: Effort normal and breath sounds normal.  Abdominal: Soft. Bowel sounds are normal. He exhibits no distension.  Musculoskeletal: He exhibits no edema.  Neurological: Coordination normal.  Skin: Skin is warm and dry. No pallor.  Psychiatric: He has a normal mood and affect. His behavior is normal. Judgment and thought content normal.      Assessment & Plan:   Problem List Items Addressed This Visit      Respiratory   Severe obstructive sleep  apnea - Primary    So glad he is using CPAP; form completed; weight loss would help        Genitourinary   Chronic kidney disease (CKD), stage II (mild)    Encouraged hydration; avoiding NSAIDs; check creatinine; may be secondary to increased muscle mass      Relevant Orders   Microalbumin / creatinine urine ratio (Completed)     Other   Preventative health care    Check yearly labs      Relevant Orders   Lipid panel (Completed)   CBC with Differential/Platelet (Completed)   COMPLETE METABOLIC PANEL WITH GFR (Completed)   Obesity, Class III, BMI 40-49.9 (morbid obesity) (HCC)    Not overeating, will check thyroid function      Fatigue    Multifactorial; OSA, but r/o thyroid disease, kidney disease, diabetes, anemia, etc.      Relevant Orders   TSH (Completed)   VITAMIN D 25 Hydroxy (Vit-D Deficiency, Fractures) (Completed)   Decreased libido    Check testosterone (but not early morning); patient would not be interested in treatment with testosterone if low      Relevant Orders   Testosterone (Completed)    Other Visit Diagnoses    Needs flu shot          Follow up plan: Return in about 3 months (around 09/09/2016) for blood pressure, weight follow-up.  An after-visit summary was printed and given to the patient at [facility].  Please see the patient instructions which may contain other information and recommendations beyond what is mentioned above in the assessment and plan.  Meds ordered this encounter  Medications  . polyethylene glycol powder (GLYCOLAX/MIRALAX) powder    Sig: Take 17 g by mouth daily. Mix with water or clear beverage of choice    Dispense:  3350 g    Refill:  5    Orders Placed This Encounter  Procedures  . Lipid panel  . CBC with Differential/Platelet  . TSH  . VITAMIN D 25 Hydroxy (Vit-D Deficiency, Fractures)  . Microalbumin / creatinine urine ratio  . Testosterone  . COMPLETE METABOLIC PANEL WITH GFR

## 2016-06-12 ENCOUNTER — Other Ambulatory Visit: Payer: Self-pay | Admitting: Family Medicine

## 2016-06-12 DIAGNOSIS — E559 Vitamin D deficiency, unspecified: Secondary | ICD-10-CM | POA: Insufficient documentation

## 2016-06-12 LAB — COMPLETE METABOLIC PANEL WITH GFR
ALBUMIN: 4.1 g/dL (ref 3.6–5.1)
ALT: 19 U/L (ref 9–46)
AST: 19 U/L (ref 10–40)
Alkaline Phosphatase: 49 U/L (ref 40–115)
BILIRUBIN TOTAL: 0.6 mg/dL (ref 0.2–1.2)
BUN: 9 mg/dL (ref 7–25)
CALCIUM: 9.9 mg/dL (ref 8.6–10.3)
CHLORIDE: 104 mmol/L (ref 98–110)
CO2: 27 mmol/L (ref 20–31)
CREATININE: 1.35 mg/dL (ref 0.60–1.35)
GFR, EST AFRICAN AMERICAN: 75 mL/min (ref >=60)
GFR, EST NON AFRICAN AMERICAN: 65 mL/min (ref >=60)
Glucose, Bld: 95 mg/dL (ref 65–99)
POTASSIUM: 4.4 mmol/L (ref 3.5–5.3)
Sodium: 141 mmol/L (ref 135–146)
Total Protein: 7.3 g/dL (ref 6.1–8.1)

## 2016-06-12 LAB — TSH: TSH: 1.93 m[IU]/L (ref 0.40–4.50)

## 2016-06-12 LAB — CBC WITH DIFFERENTIAL/PLATELET
BASOS ABS: 57 {cells}/uL (ref 0–200)
BASOS PCT: 1 %
EOS PCT: 2 %
Eosinophils Absolute: 114 cells/uL (ref 15–500)
HCT: 43.1 % (ref 38.5–50.0)
HEMOGLOBIN: 13.7 g/dL (ref 13.2–17.1)
LYMPHS ABS: 2337 {cells}/uL (ref 850–3900)
Lymphocytes Relative: 41 %
MCH: 28.2 pg (ref 27.0–33.0)
MCHC: 31.8 g/dL — ABNORMAL LOW (ref 32.0–36.0)
MCV: 88.9 fL (ref 80.0–100.0)
MONOS PCT: 8 %
MPV: 11.2 fL (ref 7.5–12.5)
Monocytes Absolute: 456 cells/uL (ref 200–950)
NEUTROS ABS: 2736 {cells}/uL (ref 1500–7800)
Neutrophils Relative %: 48 %
PLATELETS: 299 10*3/uL (ref 140–400)
RBC: 4.85 MIL/uL (ref 4.20–5.80)
RDW: 13 % (ref 11.0–15.0)
WBC: 5.7 10*3/uL (ref 3.8–10.8)

## 2016-06-12 LAB — VITAMIN D 25 HYDROXY (VIT D DEFICIENCY, FRACTURES): VIT D 25 HYDROXY: 12 ng/mL — AB (ref 30–100)

## 2016-06-12 LAB — LIPID PANEL
CHOLESTEROL: 223 mg/dL — AB (ref <200)
HDL: 38 mg/dL — ABNORMAL LOW (ref >40)
LDL CALC: 136 mg/dL — AB (ref <100)
TRIGLYCERIDES: 245 mg/dL — AB (ref <150)
Total CHOL/HDL Ratio: 5.9 Ratio — ABNORMAL HIGH (ref <5.0)
VLDL: 49 mg/dL — AB (ref <30)

## 2016-06-12 LAB — TESTOSTERONE: Testosterone: 153 ng/dL — ABNORMAL LOW (ref 250–827)

## 2016-06-12 LAB — MICROALBUMIN / CREATININE URINE RATIO
CREATININE, URINE: 258 mg/dL (ref 20–370)
MICROALB UR: 0.9 mg/dL
MICROALB/CREAT RATIO: 3 ug/mg{creat} (ref <30)

## 2016-06-12 MED ORDER — VITAMIN D (ERGOCALCIFEROL) 1.25 MG (50000 UNIT) PO CAPS: 50000.0000 [IU] | ORAL_CAPSULE | ORAL | 1 refills | Status: AC

## 2016-06-12 NOTE — Progress Notes (Signed)
Start vit D Note to patient

## 2016-08-05 ENCOUNTER — Encounter: Payer: Self-pay | Admitting: Family Medicine

## 2016-08-05 ENCOUNTER — Ambulatory Visit (INDEPENDENT_AMBULATORY_CARE_PROVIDER_SITE_OTHER): Payer: BC Managed Care – PPO | Admitting: Family Medicine

## 2016-08-05 VITALS — BP 128/72 | HR 97 | Temp 98.5°F | Resp 14 | Wt 329.0 lb

## 2016-08-05 DIAGNOSIS — S39012A Strain of muscle, fascia and tendon of lower back, initial encounter: Secondary | ICD-10-CM | POA: Diagnosis not present

## 2016-08-05 DIAGNOSIS — E559 Vitamin D deficiency, unspecified: Secondary | ICD-10-CM | POA: Diagnosis not present

## 2016-08-05 MED ORDER — METHOCARBAMOL 500 MG PO TABS: 500.0000 mg | ORAL_TABLET | Freq: Four times a day (QID) | ORAL | 0 refills | Status: DC | PRN

## 2016-08-05 NOTE — Progress Notes (Signed)
BP 128/72   Pulse 97   Temp 98.5 F (36.9 C) (Oral)   Resp 14   Wt (!) 329 lb (149.2 kg)   SpO2 93%   BMI 40.05 kg/m    Subjective:    Patient ID: Samuel Chaney, male    DOB: [birth year], 41 y.o.   MRN: [id number]  HPI: Chuckie Mccathern is a 41 y.o. male  Chief Complaint  Patient presents with  . Back Pain    right side scitaica?   Patient is here for an acute visit Right side back pain; pain is a 1 out of 10 right now when completely still; 10 out of 10 yesterday; worst pain today is 7 out of 10 Sharp pain  Radiates into the buttock on the right side Leg does not feel heavy; hard to walk at first due to pain, not due to weakness No rash like shingles He had something similar a year and a half ago Needs a new mattress he things; maybe pillow was awkwarrd No major physical stressors Woke up yesterday like this Getting a little better today No B/B dysfunction Muscle relaxant helped last year; cannot remember name Research officer, trade [employer]; 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 Allergy   . Chronic migraine w/o aura w/o status migrainosus, not intractable   . CKD (chronic kidney disease), stage II   . Decreased libido   . Dysphagia   . Elevated blood pressure reading without diagnosis of hypertension   . GERD (gastroesophageal reflux disease)   . Helicobacter positive gastritis   . IBS (irritable bowel syndrome)   . Lumbosacral pain   . Obesity, Class III, BMI 40-49.9 (morbid obesity) ([place])   . Severe obstructive sleep apnea   . Sleep apnea    History reviewed. No pertinent surgical history.   Family History    Problem Relation Age of Onset  . Diabetes Mother   . Hypertension Mother   . Heart attack Maternal Grandfather   . Cancer Paternal Grandmother    Social History  Substance Use Topics  . Smoking status: Never Smoker  . Smokeless tobacco: Never Used  . Alcohol use No   Interim medical history since last visit reviewed. Allergies and medications reviewed  Review of Systems Per HPI unless specifically indicated above     Objective:    BP 128/72   Pulse 97   Temp 98.5 F (36.9 C) (Oral)   Resp 14   Wt (!) 329 lb (149.2 kg)   SpO2 93%   BMI 40.05 kg/m   Wt Readings from Last 3 Encounters:  08/05/16 (!) 329 lb (149.2 kg)  06/11/16 (!) 330 lb 1.6 oz (149.7 kg)  03/29/15 (!) 329 lb (149.2 kg)    Physical Exam  Constitutional: He appears well-developed and well-nourished. No distress (but appears uncomfortable when standing up out of the chair).  Eyes: No scleral icterus.  Cardiovascular: Normal rate.   Pulmonary/Chest: Effort normal.  Musculoskeletal:       Lumbar back: He exhibits decreased range of motion, tenderness and spasm. He exhibits no bony tenderness, no swelling, no  edema and no deformity.       Back:  Neurological: He is alert. Gait abnormal.  LE strength 5/5; plantarflexion, dorsiflexion 5/5; slightly antalgic gait, slowed gait, somewhat deliberate  Skin: No rash (no rash over the lower back or upper right buttock) noted.  Psychiatric: He has a normal mood and affect.      Assessment & Plan:   Problem List Items Addressed This Visit      Other   Vitamin D deficiency    Finish out the Rx vitamin D, then start 1,000 iu vit D3 daily; do not take large amounts for long-term; explained too much can be dangerous; average out to 1,000 iu daily       Other Visit Diagnoses    Lumbosacral strain, initial encounter    -  Primary   note written for work; no red flags; use NSAIDs short-term, stretching, muscle relaxant       Follow up plan: No Follow-up on  file.  An after-visit summary was printed and given to the patient at [facility].  Please see the patient instructions which may contain other information and recommendations beyond what is mentioned above in the assessment and plan.  Meds ordered this encounter  Medications  . methocarbamol (ROBAXIN) 500 MG tablet    Sig: Take 1 tablet (500 mg total) by mouth every 6 (six) hours as needed for muscle spasms.    Dispense:  28 tablet    Refill:  0    No orders of the defined types were placed in this encounter.

## 2016-08-05 NOTE — Assessment & Plan Note (Signed)
Finish out the Rx vitamin D, then start 1,000 iu vit D3 daily; do not take large amounts for long-term; explained too much can be dangerous; average out to 1,000 iu daily

## 2016-08-05 NOTE — Patient Instructions (Addendum)
After you finish the prescription vitamin D, please do start 1,000 iu of vitamin D3 once a day Average out to 1,000 iu daily Use some over-the-counter naproxen (Aleve) and take one or two 220 mg pill twice a day for 3-5 days; take with food Try the stretches  Back Exercises Introduction If you have pain in your back, do these exercises 2-3 times each day or as told by your doctor. When the pain goes away, do the exercises once each day, but repeat the steps more times for each exercise (do more repetitions). If you do not have pain in your back, do these exercises once each day or as told by your doctor. Exercises Single Knee to Chest  Do these steps 3-5 times in a row for each leg: 1. Lie on your back on a firm bed or the floor with your legs stretched out. 2. Bring one knee to your chest. 3. Hold your knee to your chest by grabbing your knee or thigh. 4. Pull on your knee until you feel a gentle stretch in your lower back. 5. Keep doing the stretch for 10-30 seconds. 6. Slowly let go of your leg and straighten it. Pelvic Tilt  Do these steps 5-10 times in a row: 1. Lie on your back on a firm bed or the floor with your legs stretched out. 2. Bend your knees so they point up to the ceiling. Your feet should be flat on the floor. 3. Tighten your lower belly (abdomen) muscles to press your lower back against the floor. This will make your tailbone point up to the ceiling instead of pointing down to your feet or the floor. 4. Stay in this position for 5-10 seconds while you gently tighten your muscles and breathe evenly. Cat-Cow  Do these steps until your lower back bends more easily: 1. Get on your hands and knees on a firm surface. Keep your hands under your shoulders, and keep your knees under your hips. You may put padding under your knees. 2. Let your head hang down, and make your tailbone point down to the floor so your lower back is round like the back of a cat. 3. Stay in this  position for 5 seconds. 4. Slowly lift your head and make your tailbone point up to the ceiling so your back hangs low (sags) like the back of a cow. 5. Stay in this position for 5 seconds. Press-Ups  Do these steps 5-10 times in a row: 1. Lie on your belly (face-down) on the floor. 2. Place your hands near your head, about shoulder-width apart. 3. While you keep your back relaxed and keep your hips on the floor, slowly straighten your arms to raise the top half of your body and lift your shoulders. Do not use your back muscles. To make yourself more comfortable, you may change where you place your hands. 4. Stay in this position for 5 seconds. 5. Slowly return to lying flat on the floor. Bridges  Do these steps 10 times in a row: 1. Lie on your back on a firm surface. 2. Bend your knees so they point up to the ceiling. Your feet should be flat on the floor. 3. Tighten your butt muscles and lift your butt off of the floor until your waist is almost as high as your knees. If you do not feel the muscles working in your butt and the back of your thighs, slide your feet 1-2 inches farther away from your butt. 4.  Stay in this position for 3-5 seconds. 5. Slowly lower your butt to the floor, and let your butt muscles relax. If this exercise is too easy, try doing it with your arms crossed over your chest. Belly Crunches  Do these steps 5-10 times in a row: 1. Lie on your back on a firm bed or the floor with your legs stretched out. 2. Bend your knees so they point up to the ceiling. Your feet should be flat on the floor. 3. Cross your arms over your chest. 4. Tip your chin a little bit toward your chest but do not bend your neck. 5. Tighten your belly muscles and slowly raise your chest just enough to lift your shoulder blades a tiny bit off of the floor. 6. Slowly lower your chest and your head to the floor. Back Lifts  Do these steps 5-10 times in a row: 1. Lie on your belly (face-down) with  your arms at your sides, and rest your forehead on the floor. 2. Tighten the muscles in your legs and your butt. 3. Slowly lift your chest off of the floor while you keep your hips on the floor. Keep the back of your head in line with the curve in your back. Look at the floor while you do this. 4. Stay in this position for 3-5 seconds. 5. Slowly lower your chest and your face to the floor. Contact a doctor if:  Your back pain gets a lot worse when you do an exercise.  Your back pain does not lessen 2 hours after you exercise. If you have any of these problems, stop doing the exercises. Do not do them again unless your doctor says it is okay. Get help right away if:  You have sudden, very bad back pain. If this happens, stop doing the exercises. Do not do them again unless your doctor says it is okay. This information is not intended to replace advice given to you by your health care provider. Make sure you discuss any questions you have with your health care provider. Document Released: 07/05/2010 Document Revised: 11/08/2015 Document Reviewed: 07/27/2014  2017 Elsevier

## 2016-08-06 ENCOUNTER — Telehealth: Payer: Self-pay | Admitting: Family Medicine

## 2016-08-06 NOTE — Telephone Encounter (Signed)
Pt was seen on yesterday and was given a note to be out of work until Thursday. States his job told him that they do not have restrictions at work. That when he return he need to be 100%. Please re-write work note. Also, pt states that you will have to fill out a evaluation form and it has to be returned to his job before returning to work

## 2016-08-06 NOTE — Telephone Encounter (Signed)
Okay, then we'll shift gears completely Please write him to be out of work completely from Wednesday Aug 05, 2016 through Wednesday August 12, 2016; have him come in to see me Wednesday Feb 27th (late morning or 1:20 pm at the latest) and I'll do an exam and see if he's ready to go back Thursday to clear him to return

## 2016-08-07 NOTE — Telephone Encounter (Signed)
Called and spoke with patient he has an appt on Monday and wants to keep it, does not want to wait till Wednesday.  He wants to return to work asap.

## 2016-08-11 ENCOUNTER — Encounter: Payer: Self-pay | Admitting: Family Medicine

## 2016-08-11 ENCOUNTER — Telehealth: Payer: Self-pay | Admitting: Family Medicine

## 2016-08-11 ENCOUNTER — Ambulatory Visit: Payer: BC Managed Care – PPO | Admitting: Family Medicine

## 2016-08-11 ENCOUNTER — Ambulatory Visit (INDEPENDENT_AMBULATORY_CARE_PROVIDER_SITE_OTHER): Payer: BC Managed Care – PPO | Admitting: Family Medicine

## 2016-08-11 DIAGNOSIS — M545 Low back pain: Secondary | ICD-10-CM

## 2016-08-11 NOTE — Patient Instructions (Signed)
Return on Thursday morning bright and early at 7:15 am and I'll meet you here

## 2016-08-11 NOTE — Telephone Encounter (Signed)
Requesting return call pertaining to the his note to return to work. He thought you said for him to return on Wednesday but the letter says Thursday. Please claify [phone]

## 2016-08-11 NOTE — Progress Notes (Signed)
BP 124/76   Pulse 84   Temp 97.9 F (36.6 C) (Oral)   Resp 16   Wt (!) 325 lb (147.4 kg)   SpO2 96%   BMI 39.56 kg/m    Subjective:    Patient ID: Samuel Chaney, male    DOB: [birth year], 41 y.o.   MRN: [id number]  HPI: Samuel Chaney is a 41 y.o. male  Chief Complaint  Patient presents with  . 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 Allergy   . Chronic migraine w/o aura w/o status migrainosus, not intractable   . CKD (chronic kidney disease), stage II   . Decreased libido   . Elevated blood pressure reading without diagnosis of hypertension   . GERD (gastroesophageal reflux disease)   . Helicobacter positive gastritis   . IBS (irritable bowel syndrome)   . Lumbosacral pain   . Obesity, Class III, BMI 40-49.9 (morbid obesity) ([place])   . Severe obstructive sleep apnea   . Sleep apnea    History reviewed. No pertinent surgical history.  Social History  Substance Use Topics  . Smoking status: Never Smoker  . Smokeless tobacco: Never Used  . Alcohol use No   Interim medical history since last visit reviewed. Allergies and medications reviewed  Review of Systems Per HPI unless specifically indicated above     Objective:    BP 124/76   Pulse 84   Temp 97.9 F (36.6 C) (Oral)   Resp 16   Wt  (!) 325 lb (147.4 kg)   SpO2 96%   BMI 39.56 kg/m   Wt Readings from Last 3 Encounters:  08/13/16 (!) 327 lb 6 oz (148.5 kg)  08/11/16 (!) 325 lb (147.4 kg)  08/05/16 (!) 329 lb (149.2 kg)    Physical Exam  Constitutional: He appears well-developed and well-nourished. No distress.  obese  Cardiovascular: Normal rate.   Pulmonary/Chest: Effort normal.  Musculoskeletal:       Lumbar back: He exhibits decreased range of motion. He exhibits no tenderness, no bony tenderness, no swelling, no edema, no deformity and no spasm.       Back:  He is still a little slow getting up out of chair  Neurological: He is alert. Gait normal.  LE strength 5/5; plantarflexion, dorsiflexion 5/5  Psychiatric: He has a normal mood and affect.       Assessment & Plan:   Problem List Items Addressed This Visit      Other   Back ache    Patient is not physically ready to go back as of the time of my signing the document today, which is what is expected of the papers; I will not sign for future date; explained we'll want him to return  right before returning to work to truly verify that he is ready to perform his )potentially) physically-demanding job; he verbalizes understanding why I can't sign the paper today          Follow up plan: No Follow-up on file.  An after-visit summary was printed and given to the patient at [place].  Please see the patient instructions which may contain other information and recommendations beyond what is mentioned above in the assessment and plan.  No orders of the defined types were placed in this encounter.   No orders of the defined types were placed in this encounter.

## 2016-08-12 NOTE — Telephone Encounter (Signed)
Patient told to disregard the note he will get one on Wednesday.

## 2016-08-13 ENCOUNTER — Encounter: Payer: Self-pay | Admitting: Family Medicine

## 2016-08-13 ENCOUNTER — Ambulatory Visit (INDEPENDENT_AMBULATORY_CARE_PROVIDER_SITE_OTHER): Payer: BC Managed Care – PPO | Admitting: Family Medicine

## 2016-08-13 DIAGNOSIS — M545 Low back pain: Secondary | ICD-10-CM | POA: Diagnosis not present

## 2016-08-13 NOTE — Progress Notes (Signed)
   BP 124/70   Pulse 93   Temp 98.3 F (36.8 C) (Oral)   Resp 16   Wt (!) 327 lb 6 oz (148.5 kg)   SpO2 94%   BMI 39.85 kg/m    Subjective:    Patient ID: Samuel Chaney, male    DOB: Dec 19, 1975, 41 y.o.   MRN: [id number]  HPI: Samuel Chaney is a 41 y.o. male  Chief Complaint  Patient presents with  . Letter for School/Work    Documentation days pt came in office and forms fill out to return back to work    Patient is here to get a note to return to work See form completed today He feels that he is ready to go back to work without restrictions No concerns  Depression screen [facility] 2/9 08/13/2016 08/11/2016 08/05/2016 06/11/2016 12/13/2014  Decreased Interest 0 0 0 0 0  Down, Depressed, Hopeless 0 0 0 0 0  PHQ - 2 Score 0 0 0 0 0   Relevant past medical, surgical, family and social history reviewed Past Medical History:  Diagnosis Date  . Allergy   . Chronic migraine w/o aura w/o status migrainosus, not intractable   . CKD (chronic kidney disease), stage II   . Decreased libido   . Elevated blood pressure reading without diagnosis of hypertension   . GERD (gastroesophageal reflux disease)   . Helicobacter positive gastritis   . IBS (irritable bowel syndrome)   . Lumbosacral pain   . Obesity, Class III, BMI 40-49.9 (morbid obesity) ([place])   . Severe obstructive sleep apnea   . Sleep apnea    History reviewed. No pertinent surgical history.   Social History  Substance Use Topics  . Smoking status: Never Smoker  . Smokeless tobacco: Never Used  . Alcohol use No   Interim medical history since last visit reviewed. Allergies and medications reviewed  Review of Systems Per HPI unless specifically indicated above     Objective:    BP 124/70   Pulse 93   Temp 98.3 F (36.8 C) (Oral)   Resp 16   Wt (!) 327 lb 6 oz (148.5 kg)   SpO2 94%   BMI 39.85 kg/m   Wt Readings from Last 3 Encounters:  08/13/16 (!) 327 lb 6 oz (148.5 kg)  08/11/16 (!) 325 lb (147.4 kg)    08/05/16 (!) 329 lb (149.2 kg)    Physical Exam  Constitutional: He appears well-developed and well-nourished.  Morbidly obese  Musculoskeletal:       Right shoulder: He exhibits normal range of motion, no tenderness, no swelling and no deformity.  Neurological:  LE strength 5/5  Psychiatric: His mood appears not anxious. He does not exhibit a depressed mood.      Assessment & Plan:   Problem List Items Addressed This Visit      Other   Back ache    Resolved; may return to work tomorrow without restrictions; patient avers that he is able to do all that he needs to do for his job requirements         Follow up plan: No Follow-up on file.  An after-visit summary was printed and given to the patient at [place].  Please see the patient instructions which may contain other information and recommendations beyond what is mentioned above in the assessment and plan. See note and forms

## 2016-08-13 NOTE — Assessment & Plan Note (Signed)
Resolved; may return to work tomorrow without restrictions; patient avers that he is able to do all that he needs to do for his job requirements

## 2016-08-14 IMAGING — CR DG FOOT COMPLETE 3+V*R*
1 series · 3 of 3 positions shown · non-contrast
Comparison: None in PACs

CLINICAL DATA: Right foot pain especially in the plantar arch, no
known recent injury

EXAM:
RIGHT FOOT COMPLETE - 3+ VIEW

[Series 1: dg foot complete right · 0.14mm/px · 3 of 3 slices shown]
[im 1/3]
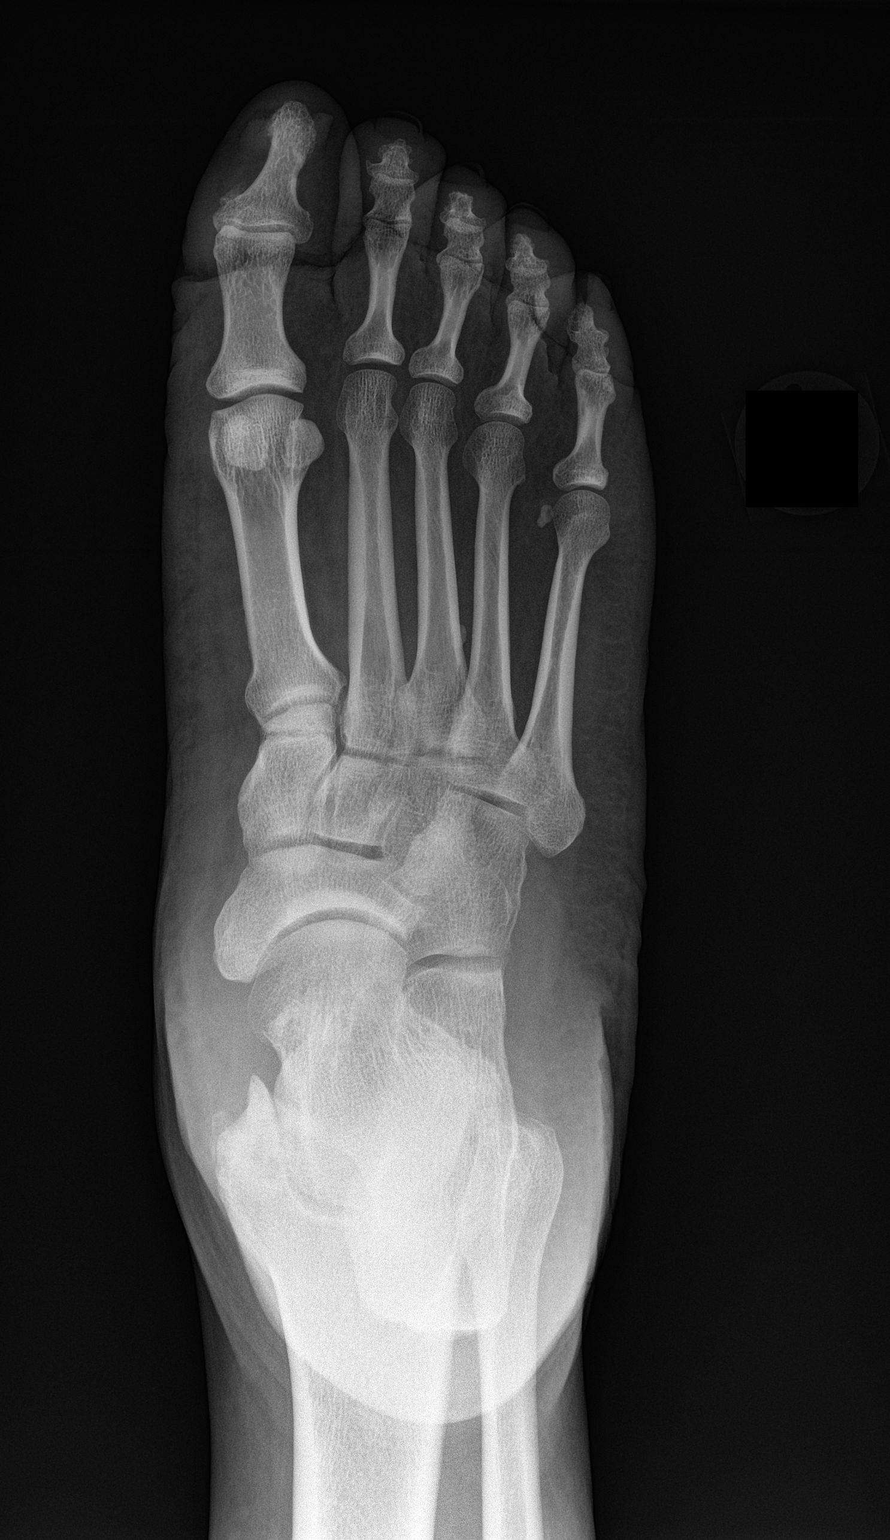
[im 2/3]
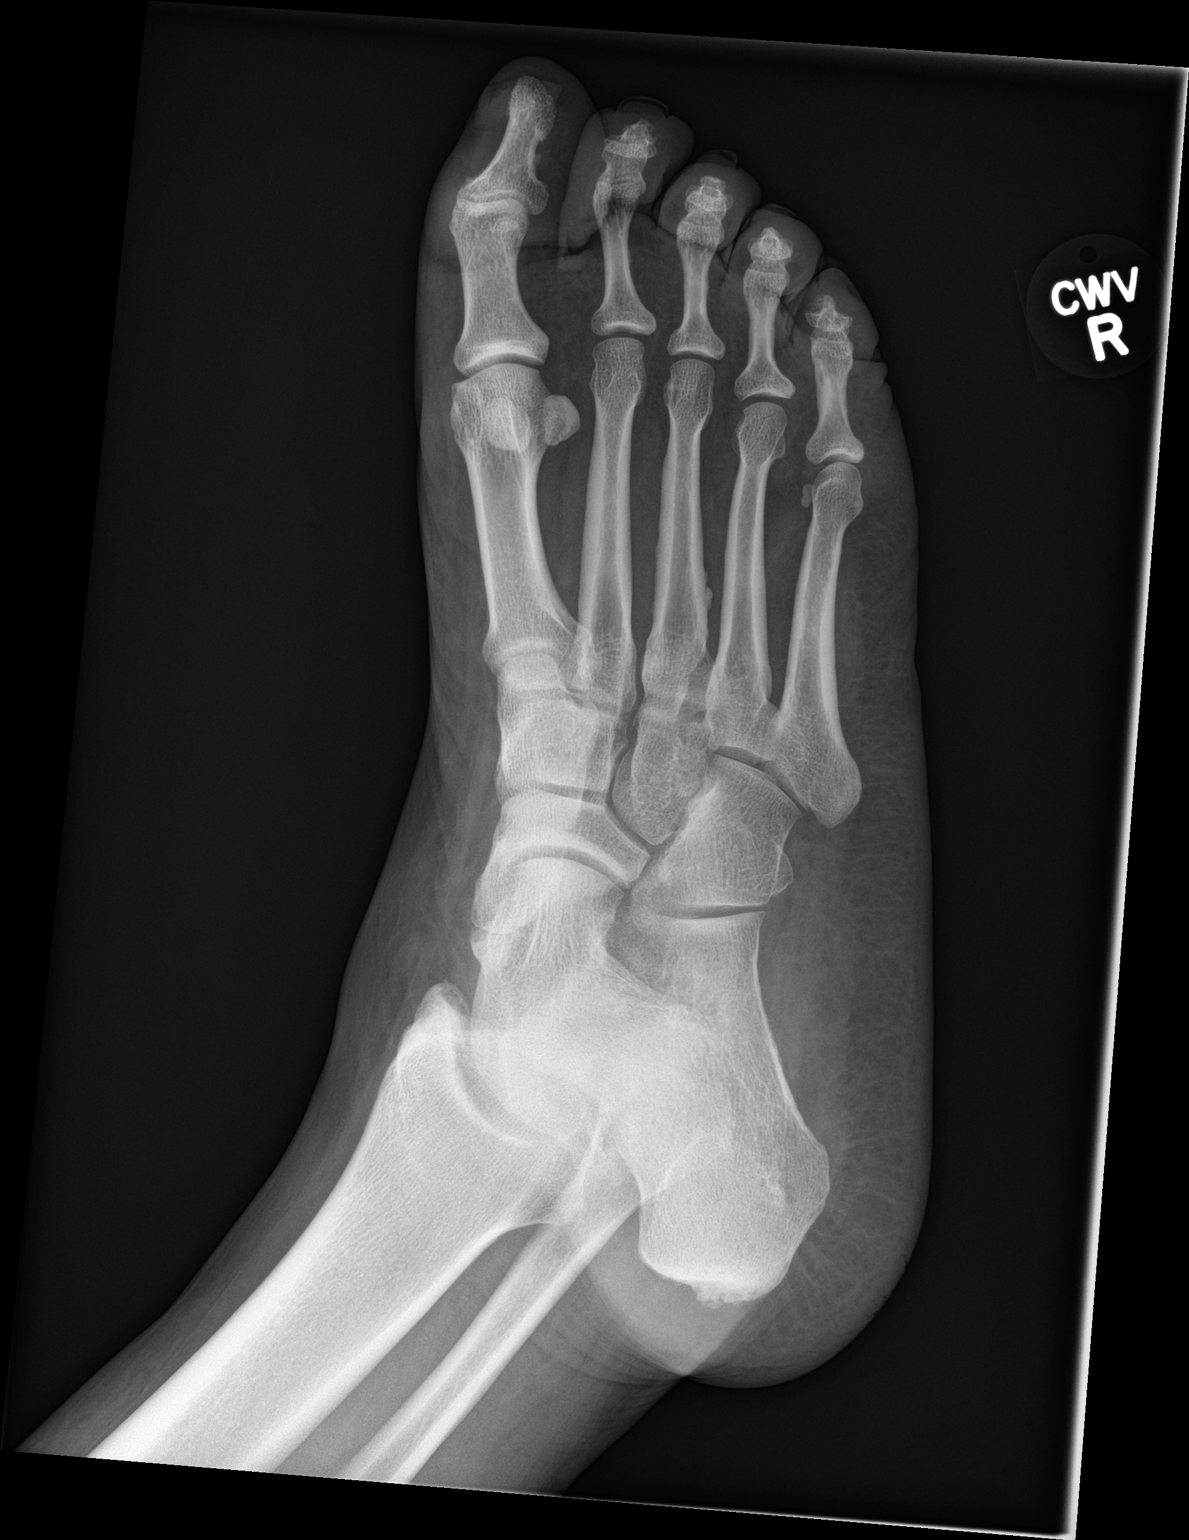
[im 3/3]
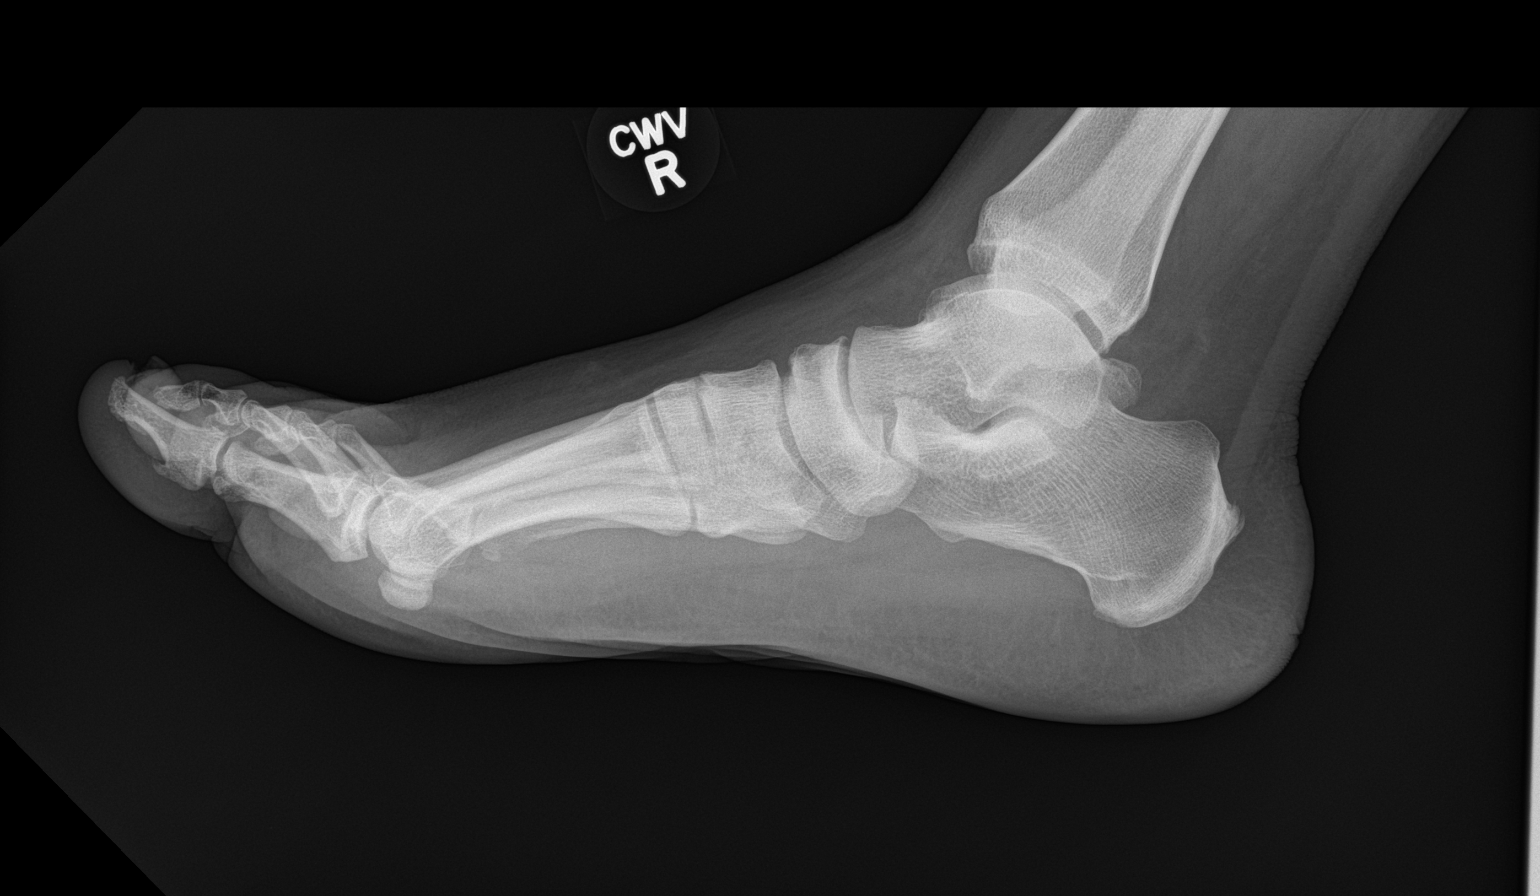

[3 of 3 positions shown; findings below may reference images not displayed]

FINDINGS: The bones of the foot are adequately mineralized. There is no acute
or healing fracture. The joint spaces are reasonably
well-maintained. There is a tiny plantar calcaneal spur and a
slightly larger Achilles region spur. The soft tissues of the
plantar region are unremarkable.
IMPRESSION: There is no acute or significant chronic bony abnormality of the
right foot.

## 2016-08-23 NOTE — Assessment & Plan Note (Signed)
Patient is not physically ready to go back as of the time of my signing the document today, which is what is expected of the papers; I will not sign for future date; explained we'll want him to return right before returning to work to truly verify that he is ready to perform his )potentially) physically-demanding job; he verbalizes understanding why I can't sign the paper today

## 2016-08-25 ENCOUNTER — Ambulatory Visit (INDEPENDENT_AMBULATORY_CARE_PROVIDER_SITE_OTHER): Payer: BC Managed Care – PPO | Admitting: Family Medicine

## 2016-08-25 ENCOUNTER — Encounter: Payer: Self-pay | Admitting: Family Medicine

## 2016-08-25 DIAGNOSIS — J101 Influenza due to other identified influenza virus with other respiratory manifestations: Secondary | ICD-10-CM | POA: Diagnosis not present

## 2016-08-25 DIAGNOSIS — R5081 Fever presenting with conditions classified elsewhere: Secondary | ICD-10-CM

## 2016-08-25 DIAGNOSIS — R6889 Other general symptoms and signs: Secondary | ICD-10-CM

## 2016-08-25 DIAGNOSIS — R05 Cough: Secondary | ICD-10-CM | POA: Diagnosis not present

## 2016-08-25 MED ORDER — OSELTAMIVIR PHOSPHATE 75 MG PO CAPS: 75.0000 mg | ORAL_CAPSULE | Freq: Two times a day (BID) | ORAL | 0 refills | Status: AC

## 2016-08-25 MED ORDER — BENZONATATE 100 MG PO CAPS: 100.0000 mg | ORAL_CAPSULE | Freq: Three times a day (TID) | ORAL | 0 refills | Status: DC | PRN

## 2016-08-25 NOTE — Progress Notes (Signed)
Name: Samuel Chaney   MRN: [id number]    DOB: 19-Jul-1975   Date:08/25/2016       Progress Note  Subjective  Chief Complaint  Chief Complaint  Patient presents with  . Acute Visit    Cough /congestion / possible flu    Cough  This is a new problem. The current episode started yesterday. The cough is productive of sputum. Associated symptoms include chills, a fever, headaches, myalgias, nasal congestion and a sore throat (tingling in throat). Pertinent negatives include no sweats. There is no history of bronchitis, COPD, environmental allergies or pneumonia.    Past Medical History:  Diagnosis Date  . Allergy   . Chronic migraine w/o aura w/o status migrainosus, not intractable   . CKD (chronic kidney disease), stage II   . Decreased libido   . Elevated blood pressure reading without diagnosis of hypertension   . GERD (gastroesophageal reflux disease)   . Helicobacter positive gastritis   . IBS (irritable bowel syndrome)   . Lumbosacral pain   . Obesity, Class III, BMI 40-49.9 (morbid obesity) ([place])   . Severe obstructive sleep apnea   . Sleep apnea     History reviewed. No pertinent surgical history.  Family History  Problem Relation Age of Onset  . Diabetes Mother   . Hypertension Mother   . Heart attack Maternal Grandfather   . Cancer Paternal Grandmother     Social History   Social History  . Marital status: Married    Spouse name: N/A  . Number of children: 2  . Years of education: N/A   Occupational History  . correctional officier    Social History Main Topics  . Smoking status: Never Smoker  . Smokeless tobacco: Never Used  . Alcohol use No  . Drug use: No  . Sexual activity: Yes    Partners: Female   Other Topics Concern  . Not on file   Social History Narrative  . No narrative on file     Current Outpatient Prescriptions:  .  polyethylene glycol powder (GLYCOLAX/MIRALAX) powder, Take 17 g by mouth daily. Mix with water or clear beverage of  choice (Patient taking differently: Take 17 g by mouth as needed. Mix with water or clear beverage of choice), Disp: 3350 g, Rfl: 5  Allergies  Allergen Reactions  . Methotrexate Derivatives Other (See Comments)    Made him confused.      Review of Systems  Constitutional: Positive for chills and fever.  HENT: Positive for sore throat (tingling in throat).   Respiratory: Positive for cough.   Musculoskeletal: Positive for myalgias.  Neurological: Positive for headaches.  Endo/Heme/Allergies: Negative for environmental allergies.    Objective  Vitals:   08/25/16 1000  BP: 128/71  Pulse: (!) 138  Resp: 18  Temp: (!) 102.8 F (39.3 C)  TempSrc: Oral  SpO2: 95%  Weight: (!) 336 lb 4.8 oz (152.5 kg)  Height: 6' 2"  (1.88 m)    Physical Exam  Constitutional: He is oriented to person, place, and time and well-developed, well-nourished, and in no distress.  Cardiovascular: Regular rhythm, S1 normal, S2 normal and normal heart sounds.  Tachycardia present.   No murmur heard. Pulmonary/Chest: Effort normal. No respiratory distress. He has no decreased breath sounds. He has wheezes (coarse breath sounds in both lung fields). He has no rhonchi. He has no rales.  Neurological: He is alert and oriented to person, place, and time.  Nursing note and vitals reviewed.  Assessment & Plan  1. Influenza-like symptoms Positive for flu B, started on Tamiflu for treatment. - oseltamivir (TAMIFLU) 75 MG capsule; Take 1 capsule (75 mg total) by mouth 2 (two) times daily.  Dispense: 10 capsule; Refill: 0 - benzonatate (TESSALON) 100 MG capsule; Take 1 capsule (100 mg total) by mouth 3 (three) times daily as needed for cough.  Dispense: 20 capsule; Refill: 0 - POCT Influenza A/B   Avigayil Ton [facility] 08/25/2016 10:09 AM

## 2016-08-26 ENCOUNTER — Ambulatory Visit: Payer: BC Managed Care – PPO | Admitting: Family Medicine

## 2016-09-02 LAB — POCT INFLUENZA A/B
INFLUENZA B, POC: POSITIVE — AB
Influenza A, POC: NEGATIVE

## 2016-09-10 ENCOUNTER — Ambulatory Visit: Payer: BC Managed Care – PPO | Admitting: Family Medicine

## 2016-11-11 ENCOUNTER — Ambulatory Visit
Admission: EM | Admit: 2016-11-11 | Discharge: 2016-11-11 | Disposition: A | Discharge status: Home / Self Care | Payer: BC Managed Care – PPO | Attending: Family Medicine | Admitting: Family Medicine

## 2016-11-11 DIAGNOSIS — M7918 Myalgia, other site: Secondary | ICD-10-CM

## 2016-11-11 DIAGNOSIS — M791 Myalgia: Secondary | ICD-10-CM

## 2016-11-11 MED ORDER — NAPROXEN 500 MG PO TABS: 500.0000 mg | ORAL_TABLET | Freq: Two times a day (BID) | ORAL | 0 refills | Status: DC

## 2016-11-11 MED ORDER — METAXALONE 800 MG PO TABS: 800.0000 mg | ORAL_TABLET | Freq: Three times a day (TID) | ORAL | 0 refills | Status: DC
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
## 2016-11-13 ENCOUNTER — Ambulatory Visit: Payer: BC Managed Care – PPO | Admitting: Family Medicine

## 2019-04-28 DIAGNOSIS — I10 Essential (primary) hypertension: Secondary | ICD-10-CM | POA: Insufficient documentation

## 2020-03-13 ENCOUNTER — Ambulatory Visit (INDEPENDENT_AMBULATORY_CARE_PROVIDER_SITE_OTHER): Payer: BC Managed Care – PPO | Admitting: Internal Medicine

## 2020-03-13 VITALS — BP 132/90 | HR 64 | Ht 74.0 in | Wt 320.0 lb

## 2020-03-13 DIAGNOSIS — G4733 Obstructive sleep apnea (adult) (pediatric): Secondary | ICD-10-CM

## 2020-03-13 DIAGNOSIS — I10 Essential (primary) hypertension: Secondary | ICD-10-CM | POA: Diagnosis not present

## 2020-03-13 DIAGNOSIS — J301 Allergic rhinitis due to pollen: Secondary | ICD-10-CM | POA: Diagnosis not present

## 2020-03-13 NOTE — Progress Notes (Signed)
[facility] [place], [place] [address]  Pulmonary Sleep Medicine   Office Visit Note  Patient Name: Samuel Chaney DOB: [birth year] MRN [id number]    Chief Complaint: Obstructive Sleep Apnea visit  Brief History:  Samuel Chaney is seen today for initial consultation The patient has a 6 year history of sleep apnea. Patient was using PAP nightly but then his supplies got so old he could not use them any more.  He then stopped using the CPAP. His sleep is more disrupted..  The patient feels more rested after sleeping with PAP.  The patient reports benefiting from PAP use. Reported sleepiness is  Better on CPAP and worse without. With CPAP the Epworth Sleepiness Score is 15 out of 24. The patient occasionally takes naps when on CPAP. The patient complains of the following: no complaints  The compliance download shows fair compliance with an average use time of 5.7 hours. The AHI is 4.6. This is an increase over the past.   The patient does not complain of limb movements disrupting sleep.  ROS  General: (-) fever, (-) chills, (-) night sweat Nose and Sinuses: (-) nasal stuffiness or itchiness, (-) postnasal drip, (-) nosebleeds, (-) sinus trouble. Mouth and Throat: (-) sore throat, (-) hoarseness. Neck: (-) swollen glands, (-) enlarged thyroid, (-) neck pain. Respiratory: - cough, - shortness of breath, - wheezing. Neurologic: +numbness, + tingling. Psychiatric: - anxiety, - depression   Current Medication: Outpatient Encounter Medications as of 03/13/2020  Medication Sig  . metaxalone (SKELAXIN) 800 MG tablet Take 1 tablet (800 mg total) by mouth 3 (three) times daily.  . naproxen (NAPROSYN) 500 MG tablet Take 1 tablet (500 mg total) by mouth 2 (two) times daily with a meal.   No facility-administered encounter medications on file as of 03/13/2020.    Surgical History: No past surgical history on file.  Medical History: Past Medical History:  Diagnosis Date   . Allergy   . Chronic migraine w/o aura w/o status migrainosus, not intractable   . CKD (chronic kidney disease), stage II   . Decreased libido   . Elevated blood pressure reading without diagnosis of hypertension   . GERD (gastroesophageal reflux disease)   . Helicobacter positive gastritis   . IBS (irritable bowel syndrome)   . Lumbosacral pain   . Obesity, Class III, BMI 40-49.9 (morbid obesity) ([place])   . Severe obstructive sleep apnea   . Sleep apnea     Family History: Non contributory to the present illness  Social History: Social History   Socioeconomic History  . Marital status: Married    Spouse name: Not on file  . Number of children: 2  . Years of education: Not on file  . Highest education level: Not on file  Occupational History  . Occupation: Optician, dispensing  Tobacco Use  . Smoking status: Never Smoker  . Smokeless tobacco: Never Used  Vaping Use  . Vaping Use: Never used  Substance and Sexual Activity  . Alcohol use: No    Alcohol/week: 0.0 standard drinks  . Drug use: No  . Sexual activity: Yes    Partners: Female  Other Topics Concern  . Not on file  Social History Narrative  . Not on file   Social Determinants of Health   Financial Resource Strain:   . Difficulty of Paying Living Expenses: Not on file  Food Insecurity:   . Worried About Charity fundraiser in the Last Year: Not on file  .  Ran Out of Food in the Last Year: Not on file  Transportation Needs:   . Lack of Transportation (Medical): Not on file  . Lack of Transportation (Non-Medical): Not on file  Physical Activity:   . Days of Exercise per Week: Not on file  . Minutes of Exercise per Session: Not on file  Stress:   . Feeling of Stress : Not on file  Social Connections:   . Frequency of Communication with Friends and Family: Not on file  . Frequency of Social Gatherings with Friends and Family: Not on file  . Attends Religious Services: Not on file  . Active Member of  Clubs or Organizations: Not on file  . Attends Archivist Meetings: Not on file  . Marital Status: Not on file  Intimate Partner Violence:   . Fear of Current or Ex-Partner: Not on file  . Emotionally Abused: Not on file  . Physically Abused: Not on file  . Sexually Abused: Not on file    Vital Signs: There were no vitals taken for this visit.  Examination: General Appearance: The patient is well-developed, well-nourished, and in no distress. Neck Circumference: 47 Skin: Gross inspection of skin unremarkable. Head: normocephalic, no gross deformities. Eyes: no gross deformities noted. ENT: ears appear grossly normal Neurologic: Alert and oriented. No involuntary movements.    EPWORTH SLEEPINESS SCALE:  Scale:  (0)= no chance of dozing; (1)= slight chance of dozing; (2)= moderate chance of dozing; (3)= high chance of dozing  Chance  Situtation    Sitting and reading: 3    Watching TV: 2    Sitting Inactive in public: 2    As a passenger in car: 2      Lying down to rest: 2    Sitting and talking: 1    Sitting quielty after lunch: 2    In a car, stopped in traffic: 1   TOTAL SCORE:   15 out of 24    SLEEP STUDIES:  1. Split 08/02/13 AHI 27 spO67mn 91%   CPAP COMPLIANCE DATA:  Date Range: 12/13/19-03/11/20  Average Daily Use: 5.7 hours  Median Use: 6.2  Compliance for > 4 Hours: 74%  AHI: 4.6 respiratory events per hour  Days Used: 75/90  Mask Leak: 19.3  95th Percentile Pressure: 8         LABS: No results found for this or any previous visit (from the past 2160 hour(s)).  Radiology: No results found.  No results found.  No results found.    Assessment and Plan: Patient Active Problem List   Diagnosis Date Noted  . Influenza-like symptoms 08/25/2016  . Vitamin D deficiency 06/12/2016  . Preventative health care 06/11/2016  . Plantar fasciitis of right foot 03/21/2015  . Primary osteoarthritis of right knee  12/15/2014  . Abdominal cramping 10/19/2014  . Allergic rhinitis 10/19/2014  . Blood pressure elevated without history of HTN 10/19/2014  . Chronic kidney disease (CKD), stage II (mild) 10/19/2014  . CN (constipation) 10/19/2014  . Gastro-esophageal reflux disease without esophagitis 10/19/2014  . Gastritis, Helicobacter pylori 057/84/6962 . Adaptive colitis 10/19/2014  . Back ache 10/19/2014  . Bleeding per rectum 10/19/2014  . Screening examination for pulmonary tuberculosis 10/19/2014  . Dietary counseling and surveillance 10/19/2014  . Severe obstructive sleep apnea 10/19/2014  . Migraine without aura and responsive to treatment 07/18/2009  . Decreased libido 10/19/2007  . Obesity, Class III, BMI 40-49.9 (morbid obesity) ([place] 10/14/2006  . Routine general medical examination at  a health care facility 10/14/2006      The patient does tolerate PAP and reports significant benefit from PAP use. He stopped using CPAP due to his supplies becoming unusable. The patient was reminded how to clean the CPAP and advised to replace supplies more frequent. The patient was also counselled on importance of weight loss in treating the sleep apnea. The compliance is very good. The apnea is within acceptable levels but increasing will increase pressure to 10 cmH 2O and a new CPAP will be ordered as his machine is past end of life.   1. OSA- continue nightly use. 30  Day follow up after set up 2. Allergic rhinitis nasal steroids as needed 3. GERD PPI as needed 4. Morbid obesity diet and exercise 5. Essential hypertension medical management  General Counseling: I have discussed the findings of the evaluation and examination with Samuel Chaney.  I have also discussed any further diagnostic evaluation thatmay be needed or ordered today. Samuel Chaney verbalizes understanding of the findings of todays visit. We also reviewed his medications today and discussed drug interactions and side effects including but not  limited excessive drowsiness and altered mental states. We also discussed that there is always a risk not just to him but also people around him. he has been encouraged to call the office with any questions or concerns that should arise related to todays visit.  No orders of the defined types were placed in this encounter.       I have personally obtained a history, examined the patient, evaluated laboratory and imaging results, formulated the assessment and plan and placed orders.   Richelle Ito Saunders Glance, PhD, FAASM  Diplomate, American Board of Sleep Medicine    Allyne Gee, MD [facility] Diplomate ABMS Pulmonary and Critical Care Medicine Sleep medicine

## 2020-03-14 NOTE — Patient Instructions (Signed)

## 2021-05-29 ENCOUNTER — Ambulatory Visit (INDEPENDENT_AMBULATORY_CARE_PROVIDER_SITE_OTHER): Payer: BC Managed Care – PPO | Admitting: Internal Medicine

## 2021-05-29 VITALS — BP 138/84 | HR 68 | Resp 18 | Ht 74.0 in | Wt 323.0 lb

## 2021-05-29 DIAGNOSIS — G4733 Obstructive sleep apnea (adult) (pediatric): Secondary | ICD-10-CM | POA: Diagnosis not present

## 2021-05-29 DIAGNOSIS — I10 Essential (primary) hypertension: Secondary | ICD-10-CM

## 2021-05-29 DIAGNOSIS — Z6841 Body Mass Index (BMI) 40.0 and over, adult: Secondary | ICD-10-CM | POA: Diagnosis not present

## 2021-05-29 DIAGNOSIS — Z7189 Other specified counseling: Secondary | ICD-10-CM

## 2021-05-29 NOTE — Patient Instructions (Signed)

## 2021-05-29 NOTE — Progress Notes (Signed)
[facility] [address], [place] [address]  Pulmonary Sleep Medicine   Office Visit Note  Patient Name: Samuel Chaney DOB: [birth year] MRN [id number]    Chief Complaint: Obstructive Sleep Apnea visit  Brief History:  Erica is seen today for his annual follow up on CPAP@10cmh20 .  The patient has a 7 year  history of sleep apnea. Patient is using PAP nightly.  The patient feels rested after sleeping with PAP.  The patient reports benefiting from PAP use. Reported sleepiness is  improved and the Epworth Sleepiness Score is 20 out of 24. The patient does take naps everyday. The patient complains of the following: none.  The compliance download shows 88% compliance with an average use time of 5 hours 54 minutes. The AHI is 2.0.  The patient does not complain of limb movements disrupting sleep. The patient's machine is no longer reporting data. Patient is more sleepy today after going a week without using machine due to desperate need for supplies and knows he needs to get these today and start back tonight and be very careful driving.  ROS  General: (-) fever, (-) chills, (-) night sweat Nose and Sinuses: (-) nasal stuffiness or itchiness, (-) postnasal drip, (-) nosebleeds, (-) sinus trouble. Mouth and Throat: (-) sore throat, (-) hoarseness. Neck: (-) swollen glands, (-) enlarged thyroid, (-) neck pain. Respiratory: - cough, - shortness of breath, - wheezing. Neurologic: + numbness, + tingling. Psychiatric: - anxiety, - depression   Current Medication: Outpatient Encounter Medications as of 05/29/2021  Medication Sig   [DISCONTINUED] losartan (COZAAR) 25 MG tablet Take by mouth.   [DISCONTINUED] metaxalone (SKELAXIN) 800 MG tablet Take 1 tablet (800 mg total) by mouth 3 (three) times daily.   [DISCONTINUED] metFORMIN (GLUCOPHAGE) 1000 MG tablet Take by mouth.   [DISCONTINUED] naproxen (NAPROSYN) 500 MG tablet Take 1 tablet (500 mg total) by mouth 2 (two)  times daily with a meal.   No facility-administered encounter medications on file as of 05/29/2021.    Surgical History: History reviewed. No pertinent surgical history.  Medical History: Past Medical History:  Diagnosis Date   Allergy    Chronic migraine w/o aura w/o status migrainosus, not intractable    CKD (chronic kidney disease), stage II    Decreased libido    Elevated blood pressure reading without diagnosis of hypertension    GERD (gastroesophageal reflux disease)    Helicobacter positive gastritis    IBS (irritable bowel syndrome)    Lumbosacral pain    Obesity, Class III, BMI 40-49.9 (morbid obesity) ([facility])    Severe obstructive sleep apnea    Sleep apnea     Family History: Non contributory to the present illness  Social History: Social History   Socioeconomic History   Marital status: Married    Spouse name: Not on file   Number of children: 2   Years of education: Not on file   Highest education level: Not on file  Occupational History   Occupation: Optician, dispensing  Tobacco Use   Smoking status: Never   Smokeless tobacco: Never  Vaping Use   Vaping Use: Never used  Substance and Sexual Activity   Alcohol use: No    Alcohol/week: 0.0 standard drinks   Drug use: No   Sexual activity: Yes    Partners: Female  Other Topics Concern   Not on file  Social History Narrative   Not on file   Social Determinants of Health   Financial Resource Strain: Not on  file  Food Insecurity: Not on file  Transportation Needs: Not on file  Physical Activity: Not on file  Stress: Not on file  Social Connections: Not on file  Intimate Partner Violence: Not on file    Vital Signs: Blood pressure 138/84, pulse 68, resp. rate 18, height 6' 2"  (1.88 m), weight (!) 323 lb (146.5 kg), SpO2 98 %. Body mass index is 41.47 kg/m.    Examination: General Appearance: The patient is well-developed, well-nourished, and in no distress. Neck Circumference: 47 Skin:  Gross inspection of skin unremarkable. Head: normocephalic, no gross deformities. Eyes: no gross deformities noted. ENT: ears appear grossly normal Neurologic: Alert and oriented. No involuntary movements.    EPWORTH SLEEPINESS SCALE:  Scale:  (0)= no chance of dozing; (1)= slight chance of dozing; (2)= moderate chance of dozing; (3)= high chance of dozing  Chance  Situtation    Sitting and reading: 3    Watching TV: 3    Sitting Inactive in public: 3    As a passenger in car: 3      Lying down to rest: 3    Sitting and talking: 0    Sitting quielty after lunch: 2    In a car, stopped in traffic: 3   TOTAL SCORE:   20 out of 24    SLEEP STUDIES: Split Study (07/2013) AHI 27/hr, min SpO2 85%, CPAP @ 14 cmH2O   CPAP COMPLIANCE DATA:  Date Range: 05/29/2020-05/28/2021  Average Daily Use: 5 hours 54 minutes  Median Use: 6 hours 11 minutes  Compliance for > 4 Hours: 88%  AHI: 2.0 respiratory events per hour  Days Used: 348/365 days  Mask Leak: 8.3  95th Percentile Pressure: 10         LABS: No results found for this or any previous visit (from the past 2160 hour(s)).  Radiology: No results found.  No results found.  No results found.    Assessment and Plan: Patient Active Problem List   Diagnosis Date Noted   Essential hypertension 04/28/2019   Influenza-like symptoms 08/25/2016   Vitamin D deficiency 06/12/2016   Preventative health care 06/11/2016   Plantar fasciitis of right foot 03/21/2015   Primary osteoarthritis of right knee 12/15/2014   Abdominal cramping 10/19/2014   Allergic rhinitis 10/19/2014   Blood pressure elevated without history of HTN 10/19/2014   Chronic kidney disease (CKD), stage II (mild) 10/19/2014   CN (constipation) 10/19/2014   Gastro-esophageal reflux disease without esophagitis 81/44/8185   Gastritis, Helicobacter pylori 63/14/9702   Adaptive colitis 10/19/2014   Back ache 10/19/2014   Bleeding per  rectum 10/19/2014   Screening examination for pulmonary tuberculosis 10/19/2014   Dietary counseling and surveillance 10/19/2014   Severe obstructive sleep apnea 10/19/2014   Severe obstructive sleep apnea 10/19/2014   Migraine without aura and responsive to treatment 07/18/2009   Decreased libido 10/19/2007   Obesity, Class III, BMI 40-49.9 (morbid obesity) ([place]) 10/14/2006   Routine general medical examination at a health care facility 10/14/2006      The patient does tolerate PAP and reports benefit from PAP use. The patient was reminded how to adjust mask fit and advised to change supplies regularly. The patient was also counselled on nightly use. The compliance is excellent. The AHI is 2.0.   1. OSA (obstructive sleep apnea) Continue excellent compliance  2. CPAP use counseling CPAP couseling-Discussed importance of adequate CPAP use as well as proper care and cleaning techniques of machine and all supplies.  3.  Essential hypertension Not on any medications, continue to monitor and f/u with PCP  4. Morbid obesity with BMI of 40.0-44.9, adult ([place]) Obesity Counseling: Had a lengthy discussion regarding patients BMI and weight issues. Patient was instructed on portion control as well as increased activity. Also discussed caloric restrictions with trying to maintain intake less than 2000 Kcal. Discussions were made in accordance with the 5As of weight management. Simple actions such as not eating late and if able to, taking a walk is suggested.    General Counseling: I have discussed the findings of the evaluation and examination with Legrand Como.  I have also discussed any further diagnostic evaluation thatmay be needed or ordered today. Melbert verbalizes understanding of the findings of todays visit. We also reviewed his medications today and discussed drug interactions and side effects including but not limited excessive drowsiness and altered mental states. We also discussed that  there is always a risk not just to him but also people around him. he has been encouraged to call the office with any questions or concerns that should arise related to todays visit.  No orders of the defined types were placed in this encounter.       I have personally obtained a history, examined the patient, evaluated laboratory and imaging results, formulated the assessment and plan and placed orders.  This patient was seen by Drema Dallas, PA-C in collaboration with Dr. Devona Konig as a part of collaborative care agreement.  Allyne Gee, MD [facility] Diplomate ABMS Pulmonary Critical Care Medicine and Sleep Medicine

## 2021-07-21 LAB — COLOGUARD: COLOGUARD: NEGATIVE

## 2021-07-21 LAB — EXTERNAL GENERIC LAB PROCEDURE: COLOGUARD: NEGATIVE

## 2022-06-04 ENCOUNTER — Ambulatory Visit: Payer: BC Managed Care – PPO | Admitting: Internal Medicine

## 2022-06-04 NOTE — Progress Notes (Signed)
Pt canceled his appointment.  

## 2022-08-25 NOTE — Progress Notes (Signed)
[facility] [place], [place] [address]  Pulmonary Sleep Medicine   Office Visit Note  Patient Name: Jacobee Heyman DOB: 1975/11/27 MRN [id number]    Chief Complaint: Obstructive Sleep Apnea visit  Brief History:  Rafay is seen today for an annual follow up visit for CPAP@ 10 cmH2O. The patient has a 9 year history of sleep apnea. Patient is using PAP nightly.  The patient feels rested after sleeping with PAP.  The patient reports benefiting from PAP use. Reported sleepiness is somewhat improved and the Epworth Sleepiness Score is 7 out of 24. The patient will take naps occasionally on the weekends. The patient complains of the following: none.  The compliance download shows 94% compliance with an average use time of 6 hours 14 minutes. The AHI is 2.5.  The patient does not complain of limb movements disrupting sleep, but does have neuropathy. The patient continues to require PAP therapy in order to eliminate his sleep apnea.   ROS  General: (-) fever, (-) chills, (-) night sweat Nose and Sinuses: (-) nasal stuffiness or itchiness, (-) postnasal drip, (-) nosebleeds, (-) sinus trouble. Mouth and Throat: (-) sore throat, (-) hoarseness. Neck: (-) swollen glands, (-) enlarged thyroid, (-) neck pain. Respiratory: - cough, - shortness of breath, - wheezing. Neurologic: + numbness, + tingling. Psychiatric: - anxiety, - depression   Current Medication: No outpatient encounter medications on file as of 08/26/2022.   No facility-administered encounter medications on file as of 08/26/2022.    Surgical History: History reviewed. No pertinent surgical history.  Medical History: Past Medical History:  Diagnosis Date   Allergy    Chronic migraine w/o aura w/o status migrainosus, not intractable    CKD (chronic kidney disease), stage II    Decreased libido    Elevated blood pressure reading without diagnosis of hypertension    GERD (gastroesophageal reflux  disease)    Helicobacter positive gastritis    IBS (irritable bowel syndrome)    Lumbosacral pain    Obesity, Class III, BMI 40-49.9 (morbid obesity) (HCC)    Severe obstructive sleep apnea    Sleep apnea     Family History: Non contributory to the present illness  Social History: Social History   Socioeconomic History   Marital status: Married    Spouse name: Not on file   Number of children: 2   Years of education: Not on file   Highest education level: Not on file  Occupational History   Occupation: Optician, dispensing  Tobacco Use   Smoking status: Never   Smokeless tobacco: Never  Vaping Use   Vaping Use: Never used  Substance and Sexual Activity   Alcohol use: No    Alcohol/week: 0.0 standard drinks of alcohol   Drug use: No   Sexual activity: Yes    Partners: Female  Other Topics Concern   Not on file  Social History Narrative   Not on file   Social Determinants of Health   Financial Resource Strain: Not on file  Food Insecurity: Not on file  Transportation Needs: Not on file  Physical Activity: Not on file  Stress: Not on file  Social Connections: Not on file  Intimate Partner Violence: Not on file    Vital Signs: Blood pressure (!) 140/85, pulse 80, resp. rate 18, height '6\' 2"'$  (1.88 m), weight (!) 324 lb (147 kg), SpO2 98 %. Body mass index is 41.6 kg/m.    Examination: General Appearance: The patient is well-developed, well-nourished, and  in no distress. Neck Circumference: 46 cm Skin: Gross inspection of skin unremarkable. Head: normocephalic, no gross deformities. Eyes: no gross deformities noted. ENT: ears appear grossly normal Neurologic: Alert and oriented. No involuntary movements.  STOP BANG RISK ASSESSMENT S (snore) Have you been told that you snore?     NO   T (tired) Are you often tired, fatigued, or sleepy during the day?   NO  O (obstruction) Do you stop breathing, choke, or gasp during sleep? NO   P (pressure) Do you  have or are you being treated for high blood pressure? NO   B (BMI) Is your body index greater than 35 kg/m? YES   A (age) Are you 4 years old or older? NO   N (neck) Do you have a neck circumference greater than 16 inches?   YES   G (gender) Are you a male? YES   TOTAL STOP/BANG "YES" ANSWERS 4       A STOP-Bang score of 2 or less is considered low risk, and a score of 5 or more is high risk for having either moderate or severe OSA. For people who score 3 or 4, doctors may need to perform further assessment to determine how likely they are to have OSA.         EPWORTH SLEEPINESS SCALE:  Scale:  (0)= no chance of dozing; (1)= slight chance of dozing; (2)= moderate chance of dozing; (3)= high chance of dozing  Chance  Situtation    Sitting and reading: 1    Watching TV: 1    Sitting Inactive in public: 1    As a passenger in car: 1      Lying down to rest: 1    Sitting and talking: 0    Sitting quielty after lunch: 1    In a car, stopped in traffic: 1   TOTAL SCORE:   7 out of 24    SLEEP STUDIES:  Split Study (07/2013) AHI 27/hr, min SpO2 85%, CPAP@ 14 cmH2O   CPAP COMPLIANCE DATA:  Date Range: 08/25/2021-08/24/2022  Average Daily Use: 6 hours 10 minutes  Median Use: 6 hours 14 minutes  Compliance for > 4 Hours: 94%  AHI: 2.5 respiratory events per hour  Days Used: 361/365 days  Mask Leak: 4.2  95th Percentile Pressure: 10         LABS: No results found for this or any previous visit (from the past 2160 hour(s)).  Radiology: No results found.  No results found.  No results found.    Assessment and Plan: Patient Active Problem List   Diagnosis Date Noted   Essential hypertension 04/28/2019   Influenza-like symptoms 08/25/2016   Vitamin D deficiency 06/12/2016   Preventative health care 06/11/2016   Plantar fasciitis of right foot 03/21/2015   Primary osteoarthritis of right knee 12/15/2014   Abdominal cramping 10/19/2014    Allergic rhinitis 10/19/2014   Blood pressure elevated without history of HTN 10/19/2014   Chronic kidney disease (CKD), stage II (mild) 10/19/2014   CN (constipation) 10/19/2014   Gastro-esophageal reflux disease without esophagitis [id number]   Gastritis, Helicobacter pylori [id number]   Adaptive colitis 10/19/2014   Back ache 10/19/2014   Bleeding per rectum 10/19/2014   Screening examination for pulmonary tuberculosis 10/19/2014   Dietary counseling and surveillance 10/19/2014   Severe obstructive sleep apnea 10/19/2014   Severe obstructive sleep apnea 10/19/2014   Migraine without aura and responsive to treatment 07/18/2009   Decreased libido 10/19/2007  Obesity, Class III, BMI 40-49.9 (morbid obesity) ([place]) 10/14/2006   Routine general medical examination at a health care facility 10/14/2006      The patient does tolerate PAP and reports benefit from PAP use. The patient was reminded how to adjust mask fit and advised to change supplies regularly. The patient was also counselled on nightly use. The compliance is excellent. The AHI is 2.5. Pt continues to require cpap to treat his apnea and is medically necessary.   1. OSA (obstructive sleep apnea) Continue excellent compliance  2. CPAP use counseling CPAP couseling-Discussed importance of adequate CPAP use as well as proper care and cleaning techniques of machine and all supplies.  3. Essential hypertension Not on any prescription medications, continue to monitor and follow up with PCP  4. Morbid obesity with BMI of 40.0-44.9, adult ([place]) Obesity Counseling: Had a lengthy discussion regarding patients BMI and weight issues. Patient was instructed on portion control as well as increased activity. Also discussed caloric restrictions with trying to maintain intake less than 2000 Kcal. Discussions were made in accordance with the 5As of weight management. Simple actions such as not eating late and if able to, taking a walk is  suggested.    General Counseling: I have discussed the findings of the evaluation and examination with Legrand Como.  I have also discussed any further diagnostic evaluation thatmay be needed or ordered today. Duain verbalizes understanding of the findings of todays visit. We also reviewed his medications today and discussed drug interactions and side effects including but not limited excessive drowsiness and altered mental states. We also discussed that there is always a risk not just to him but also people around him. he has been encouraged to call the office with any questions or concerns that should arise related to todays visit.  No orders of the defined types were placed in this encounter.       I have personally obtained a history, examined the patient, evaluated laboratory and imaging results, formulated the assessment and plan and placed orders.  This patient was seen by Drema Dallas, PA-C in collaboration with Dr. Devona Konig as a part of collaborative care agreement.  Allyne Gee, MD [facility] Diplomate ABMS Pulmonary Critical Care Medicine and Sleep Medicine

## 2022-08-26 ENCOUNTER — Ambulatory Visit (INDEPENDENT_AMBULATORY_CARE_PROVIDER_SITE_OTHER): Payer: BC Managed Care – PPO | Admitting: Internal Medicine

## 2022-08-26 VITALS — BP 140/85 | HR 80 | Resp 18 | Ht 74.0 in | Wt 324.0 lb

## 2022-08-26 DIAGNOSIS — G4733 Obstructive sleep apnea (adult) (pediatric): Secondary | ICD-10-CM

## 2022-08-26 DIAGNOSIS — Z6841 Body Mass Index (BMI) 40.0 and over, adult: Secondary | ICD-10-CM

## 2022-08-26 DIAGNOSIS — I10 Essential (primary) hypertension: Secondary | ICD-10-CM | POA: Diagnosis not present

## 2022-08-26 DIAGNOSIS — Z7189 Other specified counseling: Secondary | ICD-10-CM | POA: Diagnosis not present

## 2022-08-26 NOTE — Patient Instructions (Signed)

## 2023-08-24 NOTE — Progress Notes (Signed)
 No show

## 2023-08-25 ENCOUNTER — Ambulatory Visit: Payer: Self-pay | Admitting: Internal Medicine

## 2023-11-16 NOTE — Progress Notes (Signed)
 [facility] [address] [place], [place] [address]  Pulmonary Sleep Medicine   Office Visit Note  Patient Name: Samuel Chaney DOB: 12-24-1975 MRN [id number]    Chief Complaint: Obstructive Sleep Apnea visit  Brief History:  Samuel Chaney is seen today for an annual follow up visit for CPAP@ 10 cmH2O. The patient has a 10 year history of sleep apnea. Patient is using PAP nightly.  The patient feels rested after sleeping with PAP.  The patient reports benefiting from PAP use. Reported sleepiness is  improved and the Epworth Sleepiness Score is 11 out of 24. The patient does take naps. The patient complains of the following: pt is in need of supplies.  The compliance download shows 88% compliance with an average use time of 5 hours 57 minutes. The AHI is 3.6. The patient does not complain of limb movements disrupting sleep. The patient continues to require PAP therapy in order to eliminate sleep apnea. Pt recently had reconstruction surgery on left shoulder but has not done well and may need another surgery. This has impacted his sleep and thinks he is sleeping supine more now since limited. He had a wedge pillow he may start to use again.  ROS  General: (-) fever, (-) chills, (-) night sweat Nose and Sinuses: (-) nasal stuffiness or itchiness, (-) postnasal drip, (-) nosebleeds, (-) sinus trouble. Mouth and Throat: (-) sore throat, (-) hoarseness. Neck: (-) swollen glands, (-) enlarged thyroid, (-) neck pain. Respiratory: - cough, - shortness of breath, - wheezing. Neurologic: + numbness, + tingling. Psychiatric: - anxiety, - depression   Current Medication: Outpatient Encounter Medications as of 11/17/2023  Medication Sig   gabapentin (NEURONTIN) 300 MG capsule Take 1 capsule by mouth 3 (three) times daily.   omeprazole (PRILOSEC) 40 MG capsule    Semaglutide, 2 MG/DOSE, 8 MG/3ML SOPN Inject 2 mg into the skin.   No facility-administered encounter medications on file as of  11/17/2023.    Surgical History: History reviewed. No pertinent surgical history.  Medical History: Past Medical History:  Diagnosis Date   Allergy    Chronic migraine w/o aura w/o status migrainosus, not intractable    CKD (chronic kidney disease), stage II    Decreased libido    Elevated blood pressure reading without diagnosis of hypertension    GERD (gastroesophageal reflux disease)    Helicobacter positive gastritis    IBS (irritable bowel syndrome)    Lumbosacral pain    Obesity, Class III, BMI 40-49.9 (morbid obesity)    Severe obstructive sleep apnea    Sleep apnea     Family History: Non contributory to the present illness  Social History: Social History   Socioeconomic History   Marital status: Married    Spouse name: Not on file   Number of children: 2   Years of education: Not on file   Highest education level: Not on file  Occupational History   Occupation: Advertising account executive  Tobacco Use   Smoking status: Never   Smokeless tobacco: Never  Vaping Use   Vaping status: Never Used  Substance and Sexual Activity   Alcohol use: No    Alcohol/week: 0.0 standard drinks of alcohol   Drug use: No   Sexual activity: Yes    Partners: Female  Other Topics Concern   Not on file  Social History Narrative   Not on file   Social Drivers of Health   Financial Resource Strain: Not on file  Food Insecurity: Not on file  Transportation Needs: Not on file  Physical Activity: Not on file  Stress: Not on file  Social Connections: Not on file  Intimate Partner Violence: Not on file    Vital Signs: Blood pressure 123/80, pulse 78, resp. rate 16, height 6\' 2"  (1.88 m), weight (!) 322 lb (146.1 kg), SpO2 97%. Body mass index is 41.34 kg/m.    Examination: General Appearance: The patient is well-developed, well-nourished, and in no distress. Neck Circumference: 46 cm Skin: Gross inspection of skin unremarkable. Head: normocephalic, no gross  deformities. Eyes: no gross deformities noted. ENT: ears appear grossly normal Neurologic: Alert and oriented. No involuntary movements.  STOP BANG RISK ASSESSMENT S (snore) Have you been told that you snore?     NO   T (tired) Are you often tired, fatigued, or sleepy during the day?   NO  O (obstruction) Do you stop breathing, choke, or gasp during sleep? NO   P (pressure) Do you have or are you being treated for high blood pressure? YES   B (BMI) Is your body index greater than 35 kg/m? YES   A (age) Are you 90 years old or older? NO   N (neck) Do you have a neck circumference greater than 16 inches?   YES   G (gender) Are you a male? YES   TOTAL STOP/BANG "YES" ANSWERS 4       A STOP-Bang score of 2 or less is considered low risk, and a score of 5 or more is high risk for having either moderate or severe OSA. For people who score 3 or 4, doctors may need to perform further assessment to determine how likely they are to have OSA.         EPWORTH SLEEPINESS SCALE:  Scale:  (0)= no chance of dozing; (1)= slight chance of dozing; (2)= moderate chance of dozing; (3)= high chance of dozing  Chance  Situtation    Sitting and reading: 2    Watching TV: 2    Sitting Inactive in public: 2    As a passenger in car: 3      Lying down to rest: 2    Sitting and talking: 0    Sitting quielty after lunch: 0    In a car, stopped in traffic: 0   TOTAL SCORE:   11 out of 24    SLEEP STUDIES:  Split - 07/2013 - AHI 27/hr, min Sp02 85%, CPAP @ 14cmH20   CPAP COMPLIANCE DATA:  Date Range: 11/16/2022-11/15/2023  Average Daily Use: 5 hours 57 minutes  Median Use: 6 hours 10 minutes  Compliance for > 4 Hours: 88%  AHI: 3.6 respiratory events per hour  Days Used: 344/365 days  Mask Leak: 5  95th Percentile Pressure: 10         LABS: No results found for this or any previous visit (from the past 2160 hours).  Radiology: No results found.  No results  found.  No results found.    Assessment and Plan: Patient Active Problem List   Diagnosis Date Noted   Essential hypertension 04/28/2019   Influenza-like symptoms 08/25/2016   Vitamin D  deficiency 06/12/2016   Preventative health care 06/11/2016   Plantar fasciitis of right foot 03/21/2015   Primary osteoarthritis of right knee 12/15/2014   Abdominal cramping 10/19/2014   Allergic rhinitis 10/19/2014   Blood pressure elevated without history of HTN 10/19/2014   Chronic kidney disease (CKD), stage II (mild) 10/19/2014   Constipation 10/19/2014   Gastro-esophageal  reflux disease without esophagitis 10/19/2014   Gastritis, Helicobacter pylori 10/19/2014   Adaptive colitis 10/19/2014   Back ache 10/19/2014   Bleeding per rectum 10/19/2014   Screening examination for pulmonary tuberculosis 10/19/2014   Dietary counseling and surveillance 10/19/2014   Severe obstructive sleep apnea 10/19/2014   Severe obstructive sleep apnea 10/19/2014   Migraine without aura and responsive to treatment 07/18/2009   Decreased libido 10/19/2007   Obesity, Class III, BMI 40-49.9 (morbid obesity) 10/14/2006   Routine general medical examination at a health care facility 10/14/2006      The patient does tolerate PAP and reports benefit from PAP use. The patient was reminded how to adjust mask fit and advised to change supplies regularly. The patient was also counselled on nightly use. The compliance is excellent. The AHI is 3.6, but higher recently since surgery. Pt will use wedge pillow to avoid supine sleep that may be driving numbers up recently. May need range, but he declines for now. Will check 4 week remote download. Patient continues to require PAP to treat their apnea and is medically necessary.   1. OSA (obstructive sleep apnea) ([place]) Continue excellent compliance  2. CPAP use counseling CPAP couseling-Discussed importance of adequate CPAP use as well as proper care and cleaning  techniques of machine and all supplies.  3. Essential hypertension Continue current medication and f/u with PCP.  4. Morbid obesity with BMI of 40.0-44.9, adult (HCC) Obesity Counseling: Had a lengthy discussion regarding patients BMI and weight issues. Patient was instructed on portion control as well as increased activity. Also discussed caloric restrictions with trying to maintain intake less than 2000 Kcal. Discussions were made in accordance with the 5As of weight management. Simple actions such as not eating late and if able to, taking a walk is suggested.     General Counseling: I have discussed the findings of the evaluation and examination with Bambi Lever.  I have also discussed any further diagnostic evaluation thatmay be needed or ordered today. Segundo verbalizes understanding of the findings of todays visit. We also reviewed his medications today and discussed drug interactions and side effects including but not limited excessive drowsiness and altered mental states. We also discussed that there is always a risk not just to him but also people around him. he has been encouraged to call the office with any questions or concerns that should arise related to todays visit.  No orders of the defined types were placed in this encounter.       I have personally obtained a history, examined the patient, evaluated laboratory and imaging results, formulated the assessment and plan and placed orders.  This patient was seen by Taylor Favia, PA-C in collaboration with Dr. Cam Cava as a part of collaborative care agreement.  Cordie Deters, MD [facility] Diplomate ABMS Pulmonary Critical Care Medicine and Sleep Medicine

## 2023-11-17 ENCOUNTER — Ambulatory Visit (INDEPENDENT_AMBULATORY_CARE_PROVIDER_SITE_OTHER): Payer: Self-pay | Admitting: Internal Medicine

## 2023-11-17 VITALS — BP 123/80 | HR 78 | Resp 16 | Ht 74.0 in | Wt 322.0 lb

## 2023-11-17 DIAGNOSIS — G4733 Obstructive sleep apnea (adult) (pediatric): Secondary | ICD-10-CM | POA: Diagnosis not present

## 2023-11-17 DIAGNOSIS — Z7189 Other specified counseling: Secondary | ICD-10-CM | POA: Diagnosis not present

## 2023-11-17 DIAGNOSIS — I10 Essential (primary) hypertension: Secondary | ICD-10-CM

## 2023-11-17 DIAGNOSIS — Z6841 Body Mass Index (BMI) 40.0 and over, adult: Secondary | ICD-10-CM

## 2023-11-17 NOTE — Patient Instructions (Signed)
